# Patient Record
Sex: Male | Born: 1985 | Race: Black or African American | Hispanic: No | Marital: Single | Smoking: Current every day smoker
Health system: Southern US, Community
[De-identification: ages and names within clinical notes are randomized; demographics above are authoritative.]

## PROBLEM LIST (undated history)

## (undated) HISTORY — PX: DENTAL SURGERY: SHX609

---
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
## 2005-02-14 ENCOUNTER — Emergency Department (HOSPITAL_COMMUNITY): Admission: EM | Admit: 2005-02-14 | Discharge: 2005-02-14 | Payer: Self-pay | Admitting: Emergency Medicine

## 2005-02-15 ENCOUNTER — Emergency Department (HOSPITAL_COMMUNITY): Admission: EM | Admit: 2005-02-15 | Discharge: 2005-02-15 | Payer: Self-pay | Admitting: Emergency Medicine

## 2005-07-04 IMAGING — CR DG CHEST 1V PORT
1 series · 1 of 1 positions shown · non-contrast
Comparison: None.

CLINICAL DATA: 18-year-old, unresponsive.  Respiratory distress.  

 PORTABLE CHEST – ONE VIEW:

[view not recorded]
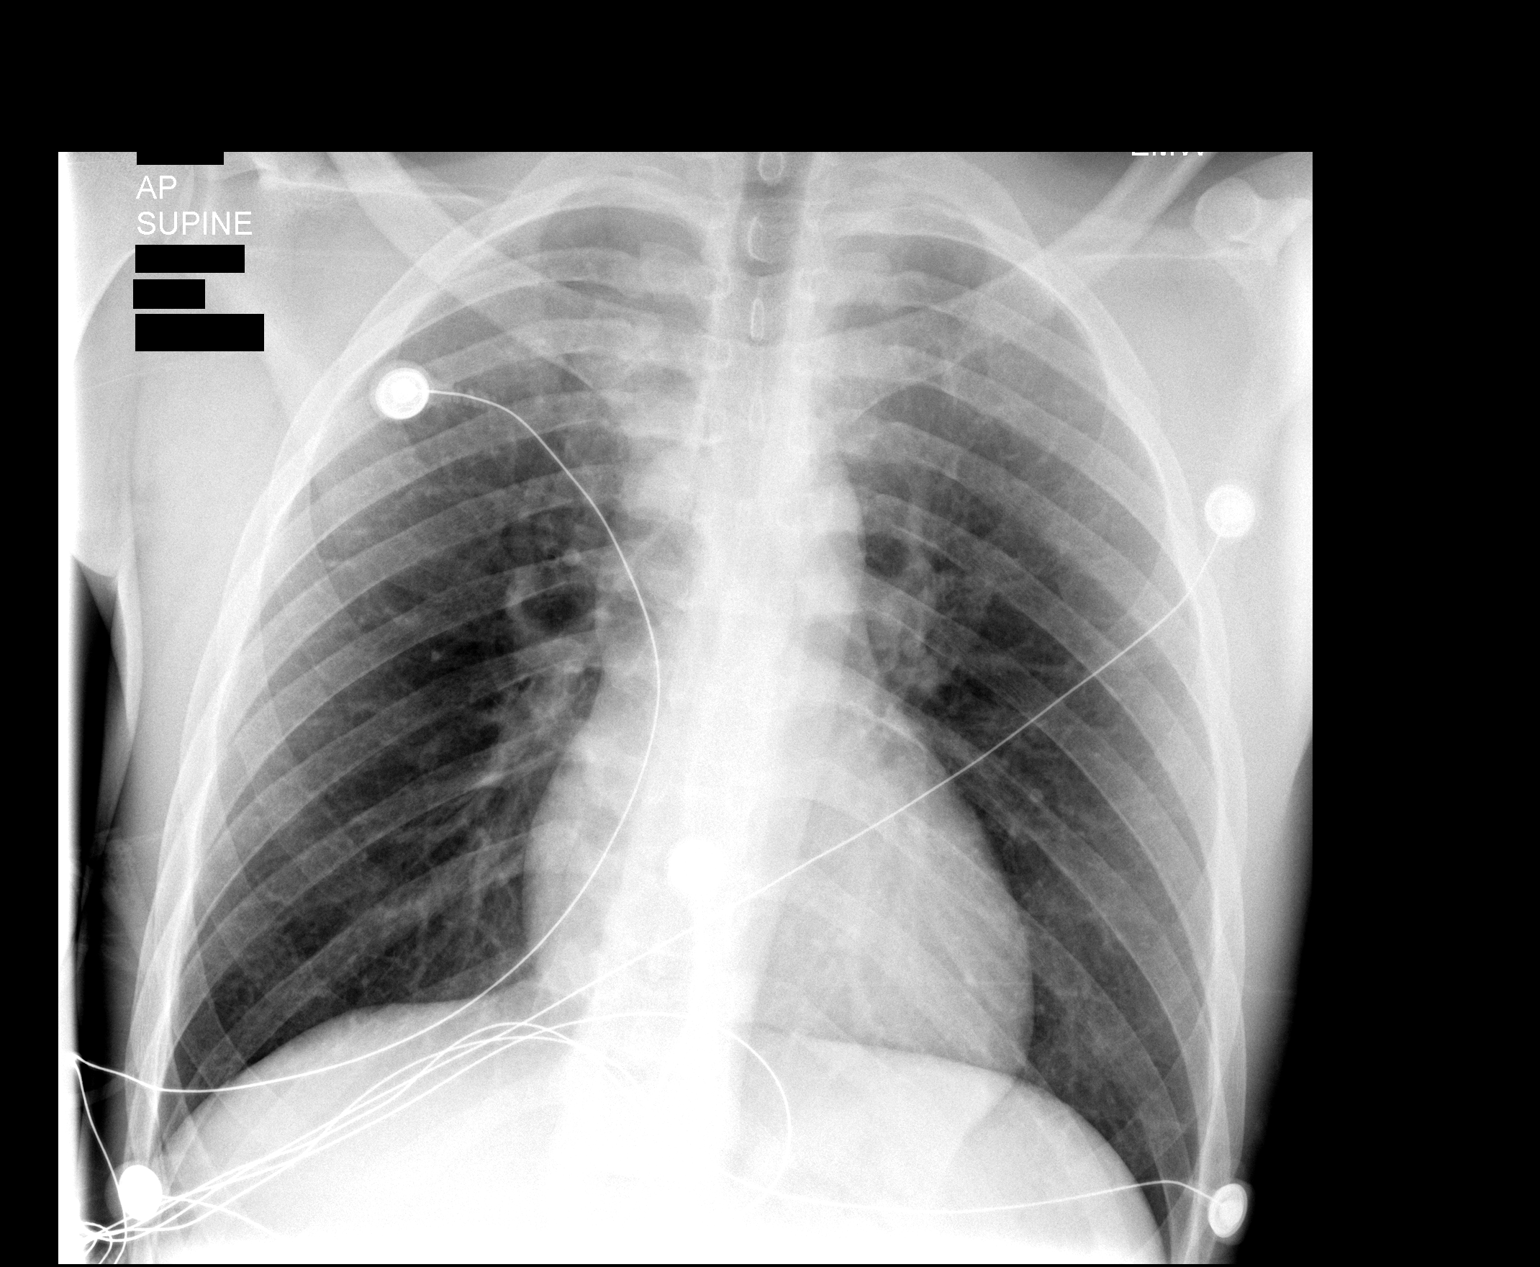

[1 of 1 positions shown; findings below may reference images not displayed]

The heart size and mediastinal contours are within normal limits. The lungs are clear.
IMPRESSION: No acute disease.

## 2007-12-14 ENCOUNTER — Emergency Department (HOSPITAL_COMMUNITY): Admission: EM | Admit: 2007-12-14 | Discharge: 2007-12-14 | Payer: Self-pay | Admitting: Emergency Medicine

## 2008-04-15 ENCOUNTER — Emergency Department (HOSPITAL_COMMUNITY): Admission: EM | Admit: 2008-04-15 | Discharge: 2008-04-15 | Payer: Self-pay | Admitting: Emergency Medicine

## 2008-11-22 ENCOUNTER — Emergency Department (HOSPITAL_COMMUNITY): Admission: EM | Admit: 2008-11-22 | Discharge: 2008-11-22 | Payer: Self-pay | Admitting: Emergency Medicine

## 2011-02-13 ENCOUNTER — Emergency Department (HOSPITAL_COMMUNITY)
Admission: EM | Admit: 2011-02-13 | Discharge: 2011-02-13 | Disposition: A | Discharge status: Home / Self Care | Payer: Self-pay | Attending: Emergency Medicine | Admitting: Emergency Medicine

## 2011-02-13 ENCOUNTER — Emergency Department (HOSPITAL_COMMUNITY)
Admission: EM | Admit: 2011-02-13 | Discharge: 2011-02-14 | Disposition: A | Discharge status: Home / Self Care | Payer: Self-pay | Attending: Emergency Medicine | Admitting: Emergency Medicine

## 2011-02-13 DIAGNOSIS — R51 Headache: Secondary | ICD-10-CM | POA: Insufficient documentation

## 2011-02-13 DIAGNOSIS — K006 Disturbances in tooth eruption: Secondary | ICD-10-CM | POA: Insufficient documentation

## 2011-02-13 DIAGNOSIS — D1739 Benign lipomatous neoplasm of skin and subcutaneous tissue of other sites: Secondary | ICD-10-CM | POA: Insufficient documentation

## 2011-02-13 DIAGNOSIS — D179 Benign lipomatous neoplasm, unspecified: Secondary | ICD-10-CM | POA: Insufficient documentation

## 2012-01-06 ENCOUNTER — Encounter (HOSPITAL_COMMUNITY): Payer: Self-pay | Admitting: *Deleted

## 2012-01-06 ENCOUNTER — Emergency Department (HOSPITAL_COMMUNITY)
Admission: EM | Admit: 2012-01-06 | Discharge: 2012-01-06 | Disposition: A | Discharge status: Home / Self Care | Payer: Self-pay | Attending: Emergency Medicine | Admitting: Emergency Medicine

## 2012-01-06 DIAGNOSIS — I889 Nonspecific lymphadenitis, unspecified: Secondary | ICD-10-CM

## 2012-01-06 DIAGNOSIS — R599 Enlarged lymph nodes, unspecified: Secondary | ICD-10-CM | POA: Insufficient documentation

## 2012-01-06 DIAGNOSIS — K029 Dental caries, unspecified: Secondary | ICD-10-CM | POA: Insufficient documentation

## 2012-01-06 MED ORDER — CEPHALEXIN 500 MG PO CAPS: 500.0000 mg | ORAL_CAPSULE | Freq: Four times a day (QID) | ORAL | Status: AC

## 2012-01-06 MED ORDER — NAPROXEN 500 MG PO TABS: 500.0000 mg | ORAL_TABLET | Freq: Two times a day (BID) | ORAL | Status: AC

## 2012-01-06 MED ORDER — CEPHALEXIN 250 MG PO CAPS
500.0000 mg | ORAL_CAPSULE | Freq: Once | ORAL | Status: AC
  Administered 2012-01-06: 500 mg via ORAL
  Filled 2012-01-06: qty 2

## 2012-01-06 NOTE — Discharge Instructions (Signed)
REturn to the ER immediately for difficulty swallowing or breathing, eat soft foods and take the antibiotic for possible infection.  If you do not get better with the antibiotic over the next 7 days, you should be seen and reevaluated by a physician - see list below.  If you develop severe pain, swelling, difficulty breathing or swallowing, return to the ER immediately.  RESOURCE GUIDE  Dental Problems  Patients with Medicaid: [facility] [phone] [address]                                           [phone] [address] Phone:  [phone]                                                  Phone:  [phone]  If unable to pay or uninsured, contact:  [facility] or [facility]. to become qualified for the adult dental clinic.  Chronic Pain Problems Contact [facility]  [phone] Patients need to be referred by their primary care doctor.  Insufficient Money for Medicine Contact [employer]:  call "211" or [facility] 774-616-1925.  No Primary Care Doctor Call [facility]  [phone] Other agencies that provide inexpensive medical care    [facility]  [phone]    [facility]  [phone]    [facility]  [phone]    [facility]  [phone]    [facility]  [phone]    [facility]  [phone]  Psychological Services [facility]  [phone] [facility]  [phone] [facility]   [phone] (emergency services [phone])  Substance Abuse Resources [facility]  [phone] [facility] [phone] [employer] [phone] Floydene Flock [phone] Residential & Outpatient Substance Abuse Program  [phone]  Abuse/Neglect [facility] Child Abuse Hotline 912-735-2374 [facility] Child Abuse Hotline 757-233-2834 (After Hours)  Emergency Shelter [facility]  Ministries 7183589930  M[facility] [place] [facility] [phone] [facility] [phone]  MRSA Hotline #:   276-113-3373    [facility] Resources  [facility]     [employer]                          [facility] Dept. [address] [place]                       [address]      [address]  [place]                                                Cristobal Goldmann Phone:  [phone]                                   Phone:  [phone]                 Phone:  [phone]  [facility] Mental [facility] Phone:  [phone]  [facility] Child Abuse Hotline 909-641-0679 [phone] (After Hours)

## 2012-01-06 NOTE — ED Notes (Signed)
Pt stated his neck has been swollen for the past four days.  Denies difficulty swallowing or breathing.  Stated he has been unable to eat due to intolerable pain.

## 2012-01-06 NOTE — ED Notes (Signed)
Reports swollen lymph nodes on (L) side x 4 days.  Reports pain with eating.  No difficulty swallowing.  Pt is noted to have a quarter size nodule on the (L) side of his neck.  respiratory unaffected.

## 2012-01-06 NOTE — ED Provider Notes (Signed)
History     CSN: [id number]  Arrival date & time 01/06/12  0007   First MD Initiated Contact with Patient 01/06/12 0113      Chief Complaint  Patient presents with  . Lymphadenopathy    (Consider location/radiation/quality/duration/timing/severity/associated sxs/prior treatment) HPI Comments: Pt had gradual onset of swelling in his left anterior cervical neck approx 4 days ago - gradually getting worse, no associated fevers, vomiting, sore throat, nasal congestion, SOB, cough or other c/o - no redness or warmth to the area 0 states worse when he eats.  He has a couple of cavities in the rear molars but has no pain in his teeth tonight.  Sx are persistent.  He has not seen a physician prior to arrival.  The history is provided by the patient.    History reviewed. No pertinent past medical history.  History reviewed. No pertinent past surgical history.  History reviewed. No pertinent family history.  History  Substance Use Topics  . Smoking status: Current Everyday Smoker -- 0.2 packs/day  . Smokeless tobacco: Not on file  . Alcohol Use: No      Review of Systems  All other systems reviewed and are negative.    Allergies  Review of patient's allergies indicates no known allergies.  Home Medications   Current Outpatient Rx  Name Route Sig Dispense Refill  . CEPHALEXIN 500 MG PO CAPS Oral Take 1 capsule (500 mg total) by mouth 4 (four) times daily. 40 capsule 0  . NAPROXEN 500 MG PO TABS Oral Take 1 tablet (500 mg total) by mouth 2 (two) times daily with a meal. 30 tablet 0    BP 138/81  Pulse 67  Temp(Src) 98.1 F (36.7 C) (Oral)  Resp 18  SpO2 100%  Physical Exam  Nursing note and vitals reviewed. Constitutional: He appears well-developed and well-nourished. No distress.  HENT:  Head: Normocephalic and atraumatic.  Mouth/Throat: Oropharynx is clear and moist. No oropharyngeal exudate.       O P clear, no erytyhema, exudate, asymetry or hypertrophy.   Normal speech, no ttp over teeth or under the tongue.  Molars with cavities but no ttp over gingiva or abscesses.  No ttp sublingual  Head with no signs of infection, rash.  Eyes: Conjunctivae and EOM are normal. Pupils are equal, round, and reactive to light. Right eye exhibits no discharge. Left eye exhibits no discharge. No scleral icterus.  Neck: Normal range of motion. Neck supple. No JVD present. No thyromegaly present.  Cardiovascular: Normal rate, regular rhythm, normal heart sounds and intact distal pulses.  Exam reveals no gallop and no friction rub.   No murmur heard. Pulmonary/Chest: Effort normal and breath sounds normal. No respiratory distress. He has no wheezes. He has no rales.  Abdominal: Soft. Bowel sounds are normal. He exhibits no distension and no mass. There is no tenderness.  Musculoskeletal: Normal range of motion. He exhibits no edema and no tenderness.  Lymphadenopathy:    He has cervical adenopathy ( single isolated 2 cm anterior cervical mobile rubbery LN on the left).  Neurological: He is alert. Coordination normal.  Skin: Skin is warm and dry. No rash noted. No erythema.  Psychiatric: He has a normal mood and affect. His behavior is normal.    ED Course  Procedures (including critical care time)  Labs Reviewed - No data to display No results found.   1. Lymphadenitis       MDM  Swollen LN but no other signs of infection -  will need abx, but I have warned pt that other entities can cause this and he needs close recheck in one week if not better - f/u list given.  Discharge Prescriptions include:  #! Keflex #2 Naprosyn        Vida Roller, MD 01/06/12 0140

## 2013-04-22 ENCOUNTER — Encounter (HOSPITAL_COMMUNITY): Payer: Self-pay | Admitting: Emergency Medicine

## 2013-04-22 ENCOUNTER — Emergency Department (INDEPENDENT_AMBULATORY_CARE_PROVIDER_SITE_OTHER)
Admission: EM | Admit: 2013-04-22 | Discharge: 2013-04-22 | Disposition: A | Discharge status: Home / Self Care | Payer: Self-pay | Source: Home / Self Care

## 2013-04-22 DIAGNOSIS — L02415 Cutaneous abscess of right lower limb: Secondary | ICD-10-CM

## 2013-04-22 DIAGNOSIS — L02419 Cutaneous abscess of limb, unspecified: Secondary | ICD-10-CM

## 2013-04-22 MED ORDER — SULFAMETHOXAZOLE-TRIMETHOPRIM 800-160 MG PO TABS: 1.0000 | ORAL_TABLET | Freq: Two times a day (BID) | ORAL | Status: DC

## 2013-04-22 MED ORDER — HYDROCODONE-ACETAMINOPHEN 5-325 MG PO TABS: 1.0000 | ORAL_TABLET | ORAL | Status: DC | PRN

## 2013-04-22 NOTE — ED Notes (Signed)
Pt c/o abscess on groin area onset yest Sx include: pain and swelling Denies: drainage, f/v/n/d Hx of freq abscesses  He is alert and oriented w/no signs of acute distress.

## 2013-04-22 NOTE — ED Provider Notes (Signed)
History     CSN: [id number]  Arrival date & time 04/22/13  1154   None     Chief Complaint  Patient presents with  . Abscess    (Consider location/radiation/quality/duration/timing/severity/associated sxs/prior treatment) HPI Comments: Patient complains of a recurring infected lesion in the right upper medial thigh. It was large and painful last night however during the day today it has decreased in size. Denies constitutional symptoms such as fever.   History reviewed. No pertinent past medical history.  History reviewed. No pertinent past surgical history.  No family history on file.  History  Substance Use Topics  . Smoking status: Current Every Day Smoker -- 0.20 packs/day  . Smokeless tobacco: Not on file  . Alcohol Use: No      Review of Systems  Constitutional: Negative.   Skin:       As per history of present illness  Psychiatric/Behavioral: Negative.   All other systems reviewed and are negative.    Allergies  Review of patient's allergies indicates no known allergies.  Home Medications   Current Outpatient Rx  Name  Route  Sig  Dispense  Refill  . HYDROcodone-acetaminophen (NORCO/VICODIN) 5-325 MG per tablet   Oral   Take 1 tablet by mouth every 4 (four) hours as needed for pain.   10 tablet   0   . sulfamethoxazole-trimethoprim (SEPTRA DS) 800-160 MG per tablet   Oral   Take 1 tablet by mouth 2 (two) times daily. X 5 days   10 tablet   0     BP 112/67  Pulse 64  Temp(Src) 97.9 F (36.6 C) (Oral)  Resp 16  SpO2 100%  Physical Exam  Nursing note and vitals reviewed. Constitutional: He is oriented to person, place, and time. He appears well-developed and well-nourished. No distress.  Neck: Normal range of motion. Neck supple.  Pulmonary/Chest: Effort normal.  Musculoskeletal: Normal range of motion.  Neurological: He is alert and oriented to person, place, and time. He exhibits normal muscle tone.  Skin: Skin is warm and dry. No  rash noted.  Is approximately 4 cm area of superficial induration with a center pustule located on the right upper medial thigh. Positive for fluctuance. No lymphangitis or erythema. No current drainage.  Psychiatric: He has a normal mood and affect.    ED Course  INCISION AND DRAINAGE Date/Time: 04/22/2013 2:25 PM Performed by: Phineas Real, Lino Wickliff Authorized by: Phineas Real, Dewain Platz Consent: Verbal consent obtained. Risks and benefits: risks, benefits and alternatives were discussed Consent given by: patient Patient understanding: patient states understanding of the procedure being performed Patient identity confirmed: verbally with patient Type: abscess Body area: lower extremity Location details: right leg Anesthesia: local infiltration Local anesthetic: lidocaine 2% with epinephrine Anesthetic total: 4 ml Patient sedated: no Scalpel size: 11 Incision type: single with marsupialization Complexity: complex Drainage: purulent and bloody Drainage amount: moderate Wound treatment: drain placed Packing material: 1/4 in gauze Comments: After injury into the wound the tissue appeared to be more like a solid cystic lesion associated with a pocket of purulence. Since this is recurring I suspect this may be an infected cystic type lesion. The marsupialization was superficial and conservative.   (including critical care time)  Labs Reviewed  CULTURE, ROUTINE-ABSCESS   No results found.   1. Abscess of right thigh       MDM   Setpra ds BID x 5 d Culture wound RTO 2days for wound check Lortab 5 q 4h prn pain.  Hayden Rasmussen, NP 04/22/13 [phone]

## 2013-04-24 ENCOUNTER — Emergency Department (INDEPENDENT_AMBULATORY_CARE_PROVIDER_SITE_OTHER)
Admission: EM | Admit: 2013-04-24 | Discharge: 2013-04-24 | Disposition: A | Discharge status: Home / Self Care | Payer: Self-pay | Source: Home / Self Care | Attending: Family Medicine | Admitting: Family Medicine

## 2013-04-24 ENCOUNTER — Encounter (HOSPITAL_COMMUNITY): Payer: Self-pay

## 2013-04-24 DIAGNOSIS — Z5189 Encounter for other specified aftercare: Secondary | ICD-10-CM

## 2013-04-24 NOTE — ED Notes (Signed)
Here for wound check and poss packing removal: NAD

## 2013-04-24 NOTE — ED Provider Notes (Signed)
History     CSN: [id number]  Arrival date & time 04/24/13  1012   First MD Initiated Contact with Patient 04/24/13 1038      Chief Complaint  Patient presents with  . Wound Check    (Consider location/radiation/quality/duration/timing/severity/associated sxs/prior treatment) HPI Comments: 27 year old male is here for wound check and removal of iodoform packing. He was seen by this provider 2 days ago for an abscess to the right medial thigh. The abscess was incised and drained and packed. He is taking his antibiotics and applying warm compresses. There is a small amount of purulent drainage to the bandage. He states the lesion is feeling better pain is better.  Patient is a 27 y.o. male presenting with wound check. The history is provided by the patient.  Wound Check    History reviewed. No pertinent past medical history.  History reviewed. No pertinent past surgical history.  History reviewed. No pertinent family history.  History  Substance Use Topics  . Smoking status: Current Every Day Smoker -- 0.20 packs/day  . Smokeless tobacco: Not on file  . Alcohol Use: No      Review of Systems  All other systems reviewed and are negative.    Allergies  Review of patient's allergies indicates no known allergies.  Home Medications   Current Outpatient Rx  Name  Route  Sig  Dispense  Refill  . HYDROcodone-acetaminophen (NORCO/VICODIN) 5-325 MG per tablet   Oral   Take 1 tablet by mouth every 4 (four) hours as needed for pain.   10 tablet   0   . sulfamethoxazole-trimethoprim (SEPTRA DS) 800-160 MG per tablet   Oral   Take 1 tablet by mouth 2 (two) times daily. X 5 days   10 tablet   0     BP 126/74  Pulse 62  Temp(Src) 98.6 F (37 C) (Oral)  Resp 16  SpO2 100%  Physical Exam  Nursing note and vitals reviewed. Constitutional: He is oriented to person, place, and time. He appears well-developed and well-nourished. He appears distressed.  Pulmonary/Chest:  Effort normal.  Musculoskeletal: Normal range of motion.  Neurological: He is alert and oriented to person, place, and time.  Skin: Skin is warm and dry.  There remains a smaller area of induration around the wound. After the packing was removed there is no further drainage or bleeding. No lymphangitis or erythema.  Psychiatric: He has a normal mood and affect.    ED Course  Procedures (including critical care time)  Labs Reviewed - No data to display No results found.   1. Wound check, abscess       MDM  The wound packing material was removed. And there is no additional drainage. A Band-Aid was placed over the opening. Patient is recommended to complete the antibiotics and continue to place warm compresses over the wound. For any worsening new symptoms or problems may return. Recommended if he is going to shave the area that he make sure that the razor is new and clean and that the area for which she shaves is washed well and prepped with isopropyl alcohol.  Hayden Rasmussen, NP 04/24/13 1134

## 2013-04-25 LAB — CULTURE, ROUTINE-ABSCESS

## 2013-05-01 NOTE — ED Provider Notes (Signed)
Medical screening examination/treatment/procedure(s) were performed by resident physician or non-physician practitioner and as supervising physician I was immediately available for consultation/collaboration.   Barkley Bruns MD.   Linna Hoff, MD 05/01/13 [phone]

## 2013-05-01 NOTE — ED Provider Notes (Signed)
Medical screening examination/treatment/procedure(s) were performed by resident physician or non-physician practitioner and as supervising physician I was immediately available for consultation/collaboration.   Valborg Friar DOUGLAS MD.   Isrrael Fluckiger D Ziya Coonrod, MD 05/01/13 1138 

## 2013-11-05 ENCOUNTER — Emergency Department (INDEPENDENT_AMBULATORY_CARE_PROVIDER_SITE_OTHER)
Admission: EM | Admit: 2013-11-05 | Discharge: 2013-11-05 | Disposition: A | Discharge status: Home / Self Care | Payer: Self-pay | Source: Home / Self Care | Attending: Family Medicine | Admitting: Family Medicine

## 2013-11-05 ENCOUNTER — Encounter (HOSPITAL_COMMUNITY): Payer: Self-pay | Admitting: Emergency Medicine

## 2013-11-05 DIAGNOSIS — K047 Periapical abscess without sinus: Secondary | ICD-10-CM

## 2013-11-05 MED ORDER — DICLOFENAC POTASSIUM 50 MG PO TABS: 50.0000 mg | ORAL_TABLET | Freq: Three times a day (TID) | ORAL | Status: DC

## 2013-11-05 MED ORDER — CLINDAMYCIN HCL 300 MG PO CAPS: 300.0000 mg | ORAL_CAPSULE | Freq: Three times a day (TID) | ORAL | Status: DC

## 2013-11-05 NOTE — ED Notes (Signed)
C/o bilateral upper jaw toothache off and on x 10 yrs., this episode onset 1430.  No dentist.

## 2013-11-05 NOTE — ED Provider Notes (Signed)
CSN: [id number]     Arrival date & time 11/05/13  1758 History   First MD Initiated Contact with Patient 11/05/13 1822     Chief Complaint  Patient presents with  . Dental Pain   (Consider location/radiation/quality/duration/timing/severity/associated sxs/prior Treatment) Patient is a 27 y.o. male presenting with tooth pain. The history is provided by the patient.  Dental Pain Location:  Upper Upper teeth location:  16/LU 3rd molar Quality:  Throbbing Severity:  Moderate Duration:  6 hours Timing:  Constant Progression:  Worsening Chronicity:  Chronic Context: dental caries   Associated symptoms: no facial swelling and no fever     History reviewed. No pertinent past medical history. History reviewed. No pertinent past surgical history. Family History  Problem Relation Age of Onset  . Diabetes Mother   . Diabetes Father    History  Substance Use Topics  . Smoking status: Current Every Day Smoker -- 0.20 packs/day    Types: Cigars  . Smokeless tobacco: Not on file  . Alcohol Use: Yes     Comment: occasionally    Review of Systems  Constitutional: Negative for fever.  HENT: Positive for dental problem. Negative for facial swelling.     Allergies  Review of patient's allergies indicates no known allergies.  Home Medications   Current Outpatient Rx  Name  Route  Sig  Dispense  Refill  . clindamycin (CLEOCIN) 300 MG capsule   Oral   Take 1 capsule (300 mg total) by mouth 3 (three) times daily.   21 capsule   0   . diclofenac (CATAFLAM) 50 MG tablet   Oral   Take 1 tablet (50 mg total) by mouth 3 (three) times daily.   21 tablet   0   . HYDROcodone-acetaminophen (NORCO/VICODIN) 5-325 MG per tablet   Oral   Take 1 tablet by mouth every 4 (four) hours as needed for pain.   10 tablet   0   . sulfamethoxazole-trimethoprim (SEPTRA DS) 800-160 MG per tablet   Oral   Take 1 tablet by mouth 2 (two) times daily. X 5 days   10 tablet   0    BP 138/91   Pulse 64  Temp(Src) 98.5 F (36.9 C) (Oral)  Resp 14  SpO2 100% Physical Exam  Nursing note and vitals reviewed. Constitutional: He appears well-developed and well-nourished.  HENT:  Head: Normocephalic.  Right Ear: External ear normal.  Left Ear: External ear normal.  Mouth/Throat: Uvula is midline, oropharynx is clear and moist and mucous membranes are normal. Abnormal dentition. Dental caries present.      ED Course  Procedures (including critical care time) Labs Review Labs Reviewed - No data to display Imaging Review No results found.  EKG Interpretation     Ventricular Rate:    PR Interval:    QRS Duration:   QT Interval:    QTC Calculation:   R Axis:     Text Interpretation:              MDM      Linna Hoff, MD 11/05/13 1927

## 2014-04-01 ENCOUNTER — Encounter (HOSPITAL_COMMUNITY): Payer: Self-pay | Admitting: Emergency Medicine

## 2014-04-01 ENCOUNTER — Emergency Department (HOSPITAL_COMMUNITY)
Admission: EM | Admit: 2014-04-01 | Discharge: 2014-04-01 | Disposition: A | Discharge status: Home / Self Care | Payer: Self-pay | Attending: Emergency Medicine | Admitting: Emergency Medicine

## 2014-04-01 DIAGNOSIS — K002 Abnormalities of size and form of teeth: Secondary | ICD-10-CM | POA: Insufficient documentation

## 2014-04-01 DIAGNOSIS — F17200 Nicotine dependence, unspecified, uncomplicated: Secondary | ICD-10-CM | POA: Insufficient documentation

## 2014-04-01 DIAGNOSIS — K0889 Other specified disorders of teeth and supporting structures: Secondary | ICD-10-CM

## 2014-04-01 DIAGNOSIS — K089 Disorder of teeth and supporting structures, unspecified: Secondary | ICD-10-CM | POA: Insufficient documentation

## 2014-04-01 DIAGNOSIS — Z791 Long term (current) use of non-steroidal anti-inflammatories (NSAID): Secondary | ICD-10-CM | POA: Insufficient documentation

## 2014-04-01 DIAGNOSIS — Z792 Long term (current) use of antibiotics: Secondary | ICD-10-CM | POA: Insufficient documentation

## 2014-04-01 MED ORDER — PENICILLIN V POTASSIUM 250 MG PO TABS: 250.0000 mg | ORAL_TABLET | Freq: Four times a day (QID) | ORAL | Status: AC

## 2014-04-01 MED ORDER — HYDROCODONE-ACETAMINOPHEN 5-325 MG PO TABS: 1.0000 | ORAL_TABLET | Freq: Four times a day (QID) | ORAL | Status: DC | PRN

## 2014-04-01 MED ORDER — HYDROCODONE-ACETAMINOPHEN 5-325 MG PO TABS
2.0000 | ORAL_TABLET | Freq: Once | ORAL | Status: AC
  Administered 2014-04-01: 2 via ORAL
  Filled 2014-04-01: qty 2

## 2014-04-01 NOTE — ED Provider Notes (Signed)
CSN: [id number]     Arrival date & time 04/01/14  2019 History  This chart was scribed for non-physician practitioner Montine Circle, PA-C working with Ezequiel Essex, MD by Rolanda Lundborg, ED Scribe. This patient was seen in room TR10C/TR10C and the patient's care was started at 8:42 PM.    Chief Complaint  Patient presents with  . Oral Swelling   The history is provided by the patient. No language interpreter was used.   HPI Comments: Dennis Pope is a 28 y.o. male who presents to the Emergency Department with the chief complaint of waxing and waning gland swelling in the left neck onset 2 1/2 years ago. He states it started flaring up yesterday. He reports associated pain with swallowing. He reports being told that the infection is coming from his teeth. He states it has swollen to the size of an egg in the past. He denies rhinorrhea, sore throat, or cough.    History reviewed. No pertinent past medical history. History reviewed. No pertinent past surgical history. Family History  Problem Relation Age of Onset  . Diabetes Mother   . Diabetes Father    History  Substance Use Topics  . Smoking status: Current Every Day Smoker -- 0.20 packs/day    Types: Cigars  . Smokeless tobacco: Not on file  . Alcohol Use: No    Review of Systems  Constitutional: Negative for fever.  HENT: Negative for rhinorrhea and sore throat.   Respiratory: Negative for cough.       Allergies  Review of patient's allergies indicates no known allergies.  Home Medications   Current Outpatient Rx  Name  Route  Sig  Dispense  Refill  . clindamycin (CLEOCIN) 300 MG capsule   Oral   Take 1 capsule (300 mg total) by mouth 3 (three) times daily.   21 capsule   0   . diclofenac (CATAFLAM) 50 MG tablet   Oral   Take 1 tablet (50 mg total) by mouth 3 (three) times daily.   21 tablet   0   . HYDROcodone-acetaminophen (NORCO/VICODIN) 5-325 MG per tablet   Oral   Take 1 tablet by mouth every 4  (four) hours as needed for pain.   10 tablet   0   . sulfamethoxazole-trimethoprim (SEPTRA DS) 800-160 MG per tablet   Oral   Take 1 tablet by mouth 2 (two) times daily. X 5 days   10 tablet   0    BP 133/78  Pulse 82  Temp(Src) 99.2 F (37.3 C) (Oral)  Resp 18  Ht 5\' 10"  (1.778 m)  Wt 157 lb 9.6 oz (71.487 kg)  BMI 22.61 kg/m2  SpO2 100% Physical Exam  Nursing note and vitals reviewed. Constitutional: He is oriented to person, place, and time. He appears well-developed and well-nourished. No distress.  HENT:  Head: Normocephalic and atraumatic.  Poor dentition throughout.  Affected tooth as diagrammed.  No signs of peritonsillar or tonsillar abscess.  No signs of gingival abscess. Oropharynx is clear and without exudates.  Uvula is midline.  Airway is intact. No signs of Ludwig's angina with palpation of oral and sublingual mucosa.   Eyes: EOM are normal.  Neck: Neck supple. No tracheal deviation present.  Cardiovascular: Normal rate.   Pulmonary/Chest: Effort normal. No respiratory distress.  Musculoskeletal: Normal range of motion.  Neurological: He is alert and oriented to person, place, and time.  Skin: Skin is warm and dry.  Psychiatric: He has a normal mood and  affect. His behavior is normal.    ED Course  Procedures (including critical care time) Medications - No data to display  DIAGNOSTIC STUDIES: Oxygen Saturation is 100% on RA, normal by my interpretation.    COORDINATION OF CARE: 8:57 PM- Discussed treatment plan with pt which includes antibiotics and pain medications. Advised pt to see a specialist if symptoms do not improve. Pt agrees to plan.    Labs Review Labs Reviewed - No data to display Imaging Review No results found.   EKG Interpretation None      MDM   Final diagnoses:  Pain, dental    Patient with toothache.  No gross abscess.  Exam unconcerning for Ludwig's angina or spread of infection.  Will treat with penicillin and pain  medicine.  Urged patient to follow-up with dentist.     I personally performed the services described in this documentation, which was scribed in my presence. The recorded information has been reviewed and is accurate.    Montine Circle, PA-C 04/02/14 [phone]

## 2014-04-01 NOTE — Discharge Instructions (Signed)

## 2014-04-01 NOTE — ED Notes (Signed)
Pt states that he has "swollen glands" on the left side of his neck that has been going on for 2.5 years.  Pt states he has been told in the past that it is coming from an infection in his teeth

## 2014-04-02 NOTE — ED Provider Notes (Signed)
Medical screening examination/treatment/procedure(s) were performed by non-physician practitioner and as supervising physician I was immediately available for consultation/collaboration.   EKG Interpretation None        Ezequiel Essex, MD 04/02/14 [phone]

## 2014-10-21 ENCOUNTER — Encounter (HOSPITAL_COMMUNITY): Payer: Self-pay | Admitting: Emergency Medicine

## 2014-10-21 ENCOUNTER — Emergency Department (INDEPENDENT_AMBULATORY_CARE_PROVIDER_SITE_OTHER)
Admission: EM | Admit: 2014-10-21 | Discharge: 2014-10-21 | Disposition: A | Discharge status: Home / Self Care | Payer: Self-pay | Source: Home / Self Care | Attending: Family Medicine | Admitting: Family Medicine

## 2014-10-21 DIAGNOSIS — K0889 Other specified disorders of teeth and supporting structures: Secondary | ICD-10-CM

## 2014-10-21 DIAGNOSIS — K088 Other specified disorders of teeth and supporting structures: Secondary | ICD-10-CM

## 2014-10-21 MED ORDER — TRAMADOL HCL 50 MG PO TABS
50.0000 mg | ORAL_TABLET | Freq: Four times a day (QID) | ORAL | Status: DC | PRN
Start: 2014-10-21 — End: 2015-01-18

## 2014-10-21 MED ORDER — AMOXICILLIN 500 MG PO CAPS: 500.0000 mg | ORAL_CAPSULE | Freq: Three times a day (TID) | ORAL | Status: DC

## 2014-10-21 NOTE — ED Provider Notes (Signed)
Dennis Pope is a 28 y.o. male who presents to Urgent Care today for tooth pain.  Patient has bilateral upper wisdom tooth pain present for 5 days. This is a recurrence of previous pain. He notes the pain is severe. The pain is worse with chewing. No fevers or chills nausea vomiting or diarrhea. No chest pains palpitations or shortness of breath. He has tried over-the-counter medications which have not helped.   History reviewed. No pertinent past medical history. History  Substance Use Topics  . Smoking status: Current Every Day Smoker -- 0.20 packs/day    Types: Cigars  . Smokeless tobacco: Not on file  . Alcohol Use: No   ROS as above Medications: No current facility-administered medications for this encounter.   Current Outpatient Prescriptions  Medication Sig Dispense Refill  . amoxicillin (AMOXIL) 500 MG capsule Take 1 capsule (500 mg total) by mouth 3 (three) times daily.  30 capsule  0  . traMADol (ULTRAM) 50 MG tablet Take 1 tablet (50 mg total) by mouth every 6 (six) hours as needed.  15 tablet  0    Exam:  BP 153/98  Pulse 61  Temp(Src) 97.6 F (36.4 C) (Oral)  Resp 16  SpO2 100% Gen: Well NAD HEENT: EOMI,  MMM upper bilateral wisdom teeth eroded to gumline tender with gumline erythema.  No results found for this or any previous visit (from the past 24 hour(s)). No results found.  Assessment and Plan: 28 y.o. male with tooth pain. Amoxicillin, tramadol follow-up as needed.  Discussed warning signs or symptoms. Please see discharge instructions. Patient expresses understanding.     Gregor Hams, MD 10/21/14 [phone]

## 2014-10-21 NOTE — ED Notes (Signed)
Pt states that he has tooth pain his upper right and left wisdom teeth need to be removed and are very painful per pt. Pt is not in any acute distress at this time.

## 2014-10-21 NOTE — Discharge Instructions (Signed)
Thank you for coming in today. ° ° °Dental Pain °A tooth ache may be caused by cavities (tooth decay). Cavities expose the nerve of the tooth to air and hot or cold temperatures. It may come from an infection or abscess (also called a boil or furuncle) around your tooth. It is also often caused by dental caries (tooth decay). This causes the pain you are having. °DIAGNOSIS  °Your caregiver can diagnose this problem by exam. °TREATMENT  °· If caused by an infection, it may be treated with medications which kill germs (antibiotics) and pain medications as prescribed by your caregiver. Take medications as directed. °· Only take over-the-counter or prescription medicines for pain, discomfort, or fever as directed by your caregiver. °· Whether the tooth ache today is caused by infection or dental disease, you should see your dentist as soon as possible for further care. °SEEK MEDICAL CARE IF: °The exam and treatment you received today has been provided on an emergency basis only. This is not a substitute for complete medical or dental care. If your problem worsens or new problems (symptoms) appear, and you are unable to meet with your dentist, call or return to this location. °SEEK IMMEDIATE MEDICAL CARE IF:  °· You have a fever. °· You develop redness and swelling of your face, jaw, or neck. °· You are unable to open your mouth. °· You have severe pain uncontrolled by pain medicine. °MAKE SURE YOU:  °· Understand these instructions. °· Will watch your condition. °· Will get help right away if you are not doing well or get worse. °Document Released: 12/10/2005 Document Revised: 03/03/2012 Document Reviewed: 07/28/2008 °ExitCare® Patient Information ©2015 [employer]. This information is not intended to replace advice given to you by your health care provider. Make sure you discuss any questions you have with your health care provider. ° ° °Low[facility]Cost [facility]: ° °[facility] - [phone] (ext  50251) ° °[address] ° °Please call Dr. Civils office [phone] or cell [phone] °[address], [place]  °Cost for tooth removal $200 includes exam, Xray, and extraction and follow up visit.  °Bring list of current medications with you.  ° °[facility] - [phone] ° °[facility] - [phone] ° °[address] ° °[facility] ° °[address], [place], [place], [address] ° °[phone], Ext. 123 ° °2nd and 4th Thursday of the month at 6:30am (Simple extractions only - no wisdom teeth or surgery) First come/First serve -First 10 clients served ° °[facility] ([place] residents only) ° °[address], [place], [place], [address] ° °[phone] ° °[facility] ° °[phone] ° °[facility] ° °[phone] ° °[facility] ° °[phone] ° °Please call [facility] at [phone] to get the details to get your tooth pulled.  ° °

## 2015-01-18 ENCOUNTER — Emergency Department (INDEPENDENT_AMBULATORY_CARE_PROVIDER_SITE_OTHER)
Admission: EM | Admit: 2015-01-18 | Discharge: 2015-01-18 | Disposition: A | Discharge status: Home / Self Care | Payer: Self-pay | Source: Home / Self Care | Attending: Emergency Medicine | Admitting: Emergency Medicine

## 2015-01-18 ENCOUNTER — Encounter (HOSPITAL_COMMUNITY): Payer: Self-pay | Admitting: Emergency Medicine

## 2015-01-18 DIAGNOSIS — K0889 Other specified disorders of teeth and supporting structures: Secondary | ICD-10-CM

## 2015-01-18 DIAGNOSIS — K088 Other specified disorders of teeth and supporting structures: Secondary | ICD-10-CM

## 2015-01-18 MED ORDER — TRAMADOL HCL 50 MG PO TABS: ORAL_TABLET | ORAL | Status: DC

## 2015-01-18 MED ORDER — AMOXICILLIN 500 MG PO CAPS: 500.0000 mg | ORAL_CAPSULE | Freq: Three times a day (TID) | ORAL | Status: DC

## 2015-01-18 NOTE — ED Notes (Signed)
Headache and toothache, right upper tooth pain

## 2015-01-18 NOTE — Discharge Instructions (Signed)

## 2015-01-18 NOTE — ED Provider Notes (Signed)
CSN: [id number]     Arrival date & time 01/18/15  1802 History   First MD Initiated Contact with Patient 01/18/15 1932     Chief Complaint  Patient presents with  . Headache  . Dental Pain   (Consider location/radiation/quality/duration/timing/severity/associated sxs/prior Treatment) HPI Comments: 29 year old male with recurrent toothache. The primary site of this toothache is the upper right posterior most molar. He has been seen for the third molar pain several times. States he does not have the money to see a dentist. There is evidence of the molars a rupturing through the gingiva and already having cavernous and eroding lesions. There is mild swelling around the right upper molars. No abscess formation is seen.   History reviewed. No pertinent past medical history. History reviewed. No pertinent past surgical history. Family History  Problem Relation Age of Onset  . Diabetes Mother   . Diabetes Father    History  Substance Use Topics  . Smoking status: Current Every Day Smoker -- 0.20 packs/day    Types: Cigars  . Smokeless tobacco: Not on file  . Alcohol Use: No    Review of Systems  HENT: Positive for dental problem.        As per history of present illness    Allergies  Review of patient's allergies indicates no known allergies.  Home Medications   Prior to Admission medications   Medication Sig Start Date End Date Taking? Authorizing Provider  amoxicillin (AMOXIL) 500 MG capsule Take 1 capsule (500 mg total) by mouth 3 (three) times daily. 01/18/15   Janne Napoleon, NP  traMADol (ULTRAM) 50 MG tablet 1-2 tabs po q 6 hr prn pain Maximum dose= 8 tablets per day 01/18/15   Janne Napoleon, NP   BP 122/88 mmHg  Pulse 77  Temp(Src) 98.8 F (37.1 C) (Oral)  Resp 18  SpO2 100% Physical Exam  Constitutional: He is oriented to person, place, and time. He appears well-developed and well-nourished. No distress.  HENT:  Mouth/Throat: Oropharynx is clear and moist. No oropharyngeal  exudate.  As in history of present illness there is mild gingival swelling to the right upper third molar which is partially erupting through the gingiva. Several areas of small cavernous lesions to the upper left and right molars.  Neck: Normal range of motion. Neck supple.  Lymphadenopathy:    He has no cervical adenopathy.  Neurological: He is alert and oriented to person, place, and time.  Skin: Skin is warm and dry.  Nursing note and vitals reviewed.   ED Course  Procedures (including critical care time) Labs Review Labs Reviewed - No data to display  Imaging Review No results found.   MDM   1. Pain, dental    Tramadol 50 mg #20 Amoxicillin as dir Needs to see dentist ASAP     Janne Napoleon, NP 01/18/15 1948

## 2015-04-16 ENCOUNTER — Encounter (HOSPITAL_COMMUNITY): Payer: Self-pay | Admitting: Emergency Medicine

## 2015-04-16 ENCOUNTER — Emergency Department (HOSPITAL_COMMUNITY)
Admission: EM | Admit: 2015-04-16 | Discharge: 2015-04-16 | Disposition: A | Discharge status: Home / Self Care | Payer: Self-pay | Attending: Emergency Medicine | Admitting: Emergency Medicine

## 2015-04-16 DIAGNOSIS — M542 Cervicalgia: Secondary | ICD-10-CM | POA: Insufficient documentation

## 2015-04-16 DIAGNOSIS — Z720 Tobacco use: Secondary | ICD-10-CM | POA: Insufficient documentation

## 2015-04-16 DIAGNOSIS — K029 Dental caries, unspecified: Secondary | ICD-10-CM | POA: Insufficient documentation

## 2015-04-16 MED ORDER — TRAMADOL HCL 50 MG PO TABS: 50.0000 mg | ORAL_TABLET | Freq: Four times a day (QID) | ORAL | Status: DC | PRN

## 2015-04-16 MED ORDER — PENICILLIN V POTASSIUM 500 MG PO TABS: 500.0000 mg | ORAL_TABLET | Freq: Three times a day (TID) | ORAL | Status: DC

## 2015-04-16 NOTE — ED Notes (Signed)
Patient here with complaint of left lower dental pain and neck swelling/pressure. States history of similar. Presents tonight for increased pain.

## 2015-04-16 NOTE — ED Provider Notes (Signed)
CSN: [id number]     Arrival date & time 04/16/15  2032 History   First MD Initiated Contact with Patient 04/16/15 2057     Chief Complaint  Patient presents with  . Dental Pain  . Neck Pain     (Consider location/radiation/quality/duration/timing/severity/associated sxs/prior Treatment) Patient is a 28 y.o. male presenting with tooth pain and neck pain. The history is provided by the patient. No language interpreter was used.  Dental Pain Location:  Upper Upper teeth location:  16/LU 3rd molar and 1/RU 3rd molar Associated symptoms: neck pain   Associated symptoms: no facial swelling and no fever   Associated symptoms comment:  Upper rear molar pain that is recurrent x 2 days. No facial swelling or fever. He reports significant decay of molars and that he has been told in the past they need to be removed. He states he has been unable to have extractions secondary to financial constraints.  Neck Pain Associated symptoms: no fever     History reviewed. No pertinent past medical history. History reviewed. No pertinent past surgical history. History reviewed. No pertinent family history. History  Substance Use Topics  . Smoking status: Current Every Day Smoker    Types: Cigars  . Smokeless tobacco: Not on file  . Alcohol Use: No    Review of Systems  Constitutional: Negative for fever and chills.  HENT: Negative for ear pain, facial swelling and trouble swallowing.        Toothache.  Gastrointestinal: Negative.   Musculoskeletal: Positive for neck pain. Negative for myalgias.  Neurological: Negative.       Allergies  Review of patient's allergies indicates no known allergies.  Home Medications   Prior to Admission medications   Not on File   BP 149/85 mmHg  Pulse 68  Temp(Src) 98.5 F (36.9 C) (Oral)  Resp 16  Ht 5' 9.5" (1.765 m)  Wt 158 lb 9 oz (71.923 kg)  BMI 23.09 kg/m2  SpO2 98% Physical Exam  Constitutional: He is oriented to person, place, and time.  He appears well-developed and well-nourished.  HENT:  Mouth/Throat: Oropharynx is clear and moist.  Significant decay to #'s 1 and 16. No visualized abscess.   Neck: Normal range of motion.  Pulmonary/Chest: Effort normal.  Musculoskeletal: Normal range of motion.  Lymphadenopathy:    He has cervical adenopathy.  Neurological: He is alert and oriented to person, place, and time.  Skin: Skin is warm and dry.  Psychiatric: He has a normal mood and affect.    ED Course  Procedures (including critical care time) Labs Review Labs Reviewed - No data to display  Imaging Review No results found.   EKG Interpretation None      MDM   Final diagnoses:  None    1. Dental decay  Resource list for dental care provided. Will cover with abx given degree of swelling/decay. Recommend ibuprofen. Rs: Damita Dunnings, PA-C 04/16/15 2108  Blanchie Dessert, MD 04/17/15 [phone]

## 2015-04-16 NOTE — Discharge Instructions (Signed)
Dental Pain A tooth ache may be caused by cavities (tooth decay). Cavities expose the nerve of the tooth to air and hot or cold temperatures. It may come from an infection or abscess (also called a boil or furuncle) around your tooth. It is also often caused by dental caries (tooth decay). This causes the pain you are having. DIAGNOSIS  Your caregiver can diagnose this problem by exam. TREATMENT   If caused by an infection, it may be treated with medications which kill germs (antibiotics) and pain medications as prescribed by your caregiver. Take medications as directed.  Only take over-the-counter or prescription medicines for pain, discomfort, or fever as directed by your caregiver.  Whether the tooth ache today is caused by infection or dental disease, you should see your dentist as soon as possible for further care. SEEK MEDICAL CARE IF: The exam and treatment you received today has been provided on an emergency basis only. This is not a substitute for complete medical or dental care. If your problem worsens or new problems (symptoms) appear, and you are unable to meet with your dentist, call or return to this location. SEEK IMMEDIATE MEDICAL CARE IF:   You have a fever.  You develop redness and swelling of your face, jaw, or neck.  You are unable to open your mouth.  You have severe pain uncontrolled by pain medicine. MAKE SURE YOU:   Understand these instructions.  Will watch your condition.  Will get help right away if you are not doing well or get worse. Document Released: 12/10/2005 Document Revised: 03/03/2012 Document Reviewed: 07/28/2008 [facility] Patient Information 2015 [place], [place]. This information is not intended to replace advice given to you by your health care provider. Make sure you discuss any questions you have with your health care provider.  Dental Caries Dental caries (also called tooth decay) is the most common oral disease. It can occur at any age but is  more common in children and young adults.  HOW DENTAL CARIES DEVELOPS  The process of decay begins when bacteria and foods (particularly sugars and starches) combine in your mouth to produce plaque. Plaque is a substance that sticks to the hard, outer surface of a tooth (enamel). The bacteria in plaque produce acids that attack enamel. These acids may also attack the root surface of a tooth (cementum) if it is exposed. Repeated attacks dissolve these surfaces and create holes in the tooth (cavities). If left untreated, the acids destroy the other layers of the tooth.  RISK FACTORS  Frequent sipping of sugary beverages.   Frequent snacking on sugary and starchy foods, especially those that easily get stuck in the teeth.   Poor oral hygiene.   Dry mouth.   Substance abuse such as methamphetamine abuse.   Broken or poor-fitting dental restorations.   Eating disorders.   Gastroesophageal reflux disease (GERD).   Certain radiation treatments to the head and neck. SYMPTOMS In the early stages of dental caries, symptoms are seldom present. Sometimes white, chalky areas may be seen on the enamel or other tooth layers. In later stages, symptoms may include:  Pits and holes on the enamel.  Toothache after sweet, hot, or cold foods or drinks are consumed.  Pain around the tooth.  Swelling around the tooth. DIAGNOSIS  Most of the time, dental caries is detected during a regular dental checkup. A diagnosis is made after a thorough medical and dental history is taken and the surfaces of your teeth are checked for signs of  dental caries. Sometimes special instruments, such as lasers, are used to check for dental caries. Dental X-ray exams may be taken so that areas not visible to the eye (such as between the contact areas of the teeth) can be checked for cavities.  TREATMENT  If dental caries is in its early stages, it may be reversed with a fluoride treatment or an application of a  remineralizing agent at the dental office. Thorough brushing and flossing at home is needed to aid these treatments. If it is in its later stages, treatment depends on the location and extent of tooth destruction:   If a small area of the tooth has been destroyed, the destroyed area will be removed and cavities will be filled with a material such as gold, silver amalgam, or composite resin.   If a large area of the tooth has been destroyed, the destroyed area will be removed and a cap (crown) will be fitted over the remaining tooth structure.   If the center part of the tooth (pulp) is affected, a procedure called a root canal will be needed before a filling or crown can be placed.   If most of the tooth has been destroyed, the tooth may need to be pulled (extracted). HOME CARE INSTRUCTIONS You can prevent, stop, or reverse dental caries at home by practicing good oral hygiene. Good oral hygiene includes:  Thoroughly cleaning your teeth at least twice a day with a toothbrush and dental floss.   Using a fluoride toothpaste. A fluoride mouth rinse may also be used if recommended by your dentist or health care provider.   Restricting the amount of sugary and starchy foods and sugary liquids you consume.   Avoiding frequent snacking on these foods and sipping of these liquids.   Keeping regular visits with a dentist for checkups and cleanings. PREVENTION   Practice good oral hygiene.  Consider a dental sealant. A dental sealant is a coating material that is applied by your dentist to the pits and grooves of teeth. The sealant prevents food from being trapped in them. It may protect the teeth for several years.  Ask about fluoride supplements if you live in a community without fluorinated water or with water that has a low fluoride content. Use fluoride supplements as directed by your dentist or health care provider.  Allow fluoride varnish applications to teeth if directed by your  dentist or health care provider. Document Released: 09/01/2002 Document Revised: 04/26/2014 Document Reviewed: 12/12/2012 [facility] Patient Information 2015 [place], [place]. This information is not intended to replace advice given to you by your health care provider. Make sure you discuss any questions you have with your health care provider.  Emergency Department Resource Guide 1) Find a Doctor and Pay Out of Pocket Although you won't have to find out who is covered by your insurance plan, it is a good idea to ask around and get recommendations. You will then need to call the office and see if the doctor you have chosen will accept you as a new patient and what types of options they offer for patients who are self-pay. Some doctors offer discounts or will set up payment plans for their patients who do not have insurance, but you will need to ask so you aren't surprised when you get to your appointment.  2) Contact Your Local Health Department Not all health departments have doctors that can see patients for sick visits, but many do, so it is worth a call to see if  yours does. If you don't know where your local health department is, you can check in your phone book. The CDC also has a tool to help you locate your state's health department, and many state websites also have listings of all of their local health departments.  3) Find a [facility] If your illness is not likely to be very severe or complicated, you may want to try a walk in clinic. These are popping up all over the country in pharmacies, drugstores, and shopping centers. They're usually staffed by nurse practitioners or physician assistants that have been trained to treat common illnesses and complaints. They're usually fairly quick and inexpensive. However, if you have serious medical issues or chronic medical problems, these are probably not your best option.  No Primary Care Doctor: - Call Health Connect at  [phone] - they can help  you locate a primary care doctor that  accepts your insurance, provides certain services, etc. - Physician Referral Service- [phone]  Chronic Pain Problems: Organization         Address  Phone   Notes  [facility]  [phone] Patients need to be referred by their primary care doctor.   Medication Assistance: Organization         Address  Phone   Notes  [facility] Medication [facility] [address] [place], [place] [address] [phone] --Must be a resident of [facility] -- Must have NO insurance coverage whatsoever (no Medicaid/ Medicare, etc.) -- The pt. MUST have a primary care doctor that directs their care regularly and follows them in the community   [facility]ist  [phone]   [employer]  [phone]    Agencies that provide inexpensive medical care: Organization         Address  Phone   Notes  [place]  [phone]   [facility]    [phone]   [facility] [place], [place] [address] [phone]   [place] [address] [place] [phone]   [facility]anned Parenthood    [phone]   [facility]    [phone]   [place] and [place] [address], [place] Phone:  [phone], Fax:  [phone] Hours of Operation:  9 am - 6 pm, M-F.  Also accepts Medicaid/Medicare and self-pay.  [facility] for [place], [address], [place] Phone: [phone], Fax: [phone]. Hours of Operation:  8:30 am - 5:30 pm, M-F.  Also accepts Medicaid and self-pay.  [facility] [place] [address] [place] Phone: [phone]   [place], [place], [place] [phone], Ext. [phone] Mondays & Thursdays: 7-9 AM.  First 15 patients are seen on a first come, first serve basis.    [place] Providers:  Organization         Address  Phone   Notes  [facility] [address], [place] [phone] Also accepts self-pay patients.  [place], [place]  [phone]   [place], [address], [place] [phone]   [place] [address] [place] [phone]   Lucianne Lei [address], [place]   [phone])  [phone] Only accepts [place] Access Medicaid patients after they have their name applied to their card.   Self-Pay (no insurance) in [facility]:  Organization         Address  Phone   Notes  Sickle Cell Patients, [facility] [place] [phone]   [facility] [place] [phone]   [facility] [place]  [place], [place], [place] [phone]   [facility]/Dr. Osei-Bonsu  [address], [place] or [address], [place] [phone] Phone number for both [place] and [place] locations is the same.  [facility] and [facility] [address] [place] [phone]   [facility] [address], [place] or [address] [phone]   [facility] [address] [place] [phone], phone; [phone], fax Sees patients 1st and 3rd Saturday of every month.  Must not qualify for public or private insurance (i.e. Medicaid, Medicare, [place] Health Choice, Veterans' Benefits)  Household income should be no more than 200% of the poverty level The clinic cannot treat you if you are pregnant or think you are pregnant  Sexually transmitted diseases are not treated at the clinic.    Dental Care: Organization         Address  Phone  Notes  [facility] Department of [facility] [phone] Accepts children up to age 42 who are enrolled in [place] or [place]; pregnant women with a Medicaid card; and children who have applied for Medicaid or [place] Health Choice, but were declined, whose parents can pay a reduced fee at time of service.  [facility] Department of [facility]  [address], [place] [phone] Accepts children up to age 25 who are enrolled in [place] or [place]; pregnant women with a Medicaid card; and children who have applied for Medicaid or [place] Health Choice, but were declined, whose parents can pay a reduced fee at time of service.  [place] Adult Dental Access PROGRAM  [place] [phone] Patients are seen by appointment only. Walk-ins are not accepted. [place] will see patients 12 years of age and older. Monday - Tuesday (8am-5pm) Most Wednesdays (8:30-5pm) $30 per visit, cash only  [facility] Adult Dental Access PROGRAM  [address], [facility] [phone] Patients are seen by appointment only. Walk-ins are not accepted. [place] will see patients 73 years of age and older. One Wednesday Evening (Monthly: Volunteer Based).  $30 per visit, cash only  [place]  [phone] for adults; Children under age 59, call Graduate Pediatric Dentistry at [phone]. Children aged 26-14, please call [phone] to request a pediatric application.  Dental services are provided in all areas of dental care including fillings, crowns and bridges, complete and partial dentures, implants, gum treatment, root canals, and extractions. Preventive care is also provided. Treatment is provided to both adults and children. Patients are selected via a lottery and there is often a waiting list.   [facility] [address], [place]  [phone] [email or web address], [place], [place] [phone], Ext. [phone] Second and Fourth Thursday of each month, opens at 6:30 AM;  Clinic ends at 9 AM.  Patients are seen on a first-come first-served basis, and a limited number are seen during each clinic.   [facility]  [address] [place] D[place]er [place], [place] [phone]   Eligibility Requirements You must have lived in [place], [place], or [place] counties for at least the last three months.   You cannot be eligible for state or federal sponsored [employer], including [employer], [place], or [employer].   You generally cannot be eligible for healthcare insurance through your employer.    How to apply: Eligibility screenings are held every Tuesday and Wednesday afternoon from 1:00 pm until 4:00 pm. You do not need an appointment for the interview!  [facility] [address], [place], [place]   [place]  [place]  [place]  [phone]

## 2015-06-30 ENCOUNTER — Encounter (HOSPITAL_COMMUNITY): Payer: Self-pay | Admitting: Emergency Medicine

## 2015-06-30 ENCOUNTER — Emergency Department (HOSPITAL_COMMUNITY)
Admission: EM | Admit: 2015-06-30 | Discharge: 2015-06-30 | Disposition: A | Discharge status: Home / Self Care | Payer: Self-pay | Attending: Emergency Medicine | Admitting: Emergency Medicine

## 2015-06-30 DIAGNOSIS — Z720 Tobacco use: Secondary | ICD-10-CM | POA: Insufficient documentation

## 2015-06-30 DIAGNOSIS — K029 Dental caries, unspecified: Secondary | ICD-10-CM | POA: Insufficient documentation

## 2015-06-30 DIAGNOSIS — Z792 Long term (current) use of antibiotics: Secondary | ICD-10-CM | POA: Insufficient documentation

## 2015-06-30 DIAGNOSIS — K088 Other specified disorders of teeth and supporting structures: Secondary | ICD-10-CM | POA: Insufficient documentation

## 2015-06-30 DIAGNOSIS — K0889 Other specified disorders of teeth and supporting structures: Secondary | ICD-10-CM

## 2015-06-30 MED ORDER — PENICILLIN V POTASSIUM 500 MG PO TABS: 500.0000 mg | ORAL_TABLET | Freq: Four times a day (QID) | ORAL | Status: AC

## 2015-06-30 MED ORDER — TRAMADOL HCL 50 MG PO TABS: 50.0000 mg | ORAL_TABLET | Freq: Four times a day (QID) | ORAL | Status: DC | PRN

## 2015-06-30 MED ORDER — IBUPROFEN 600 MG PO TABS: 600.0000 mg | ORAL_TABLET | Freq: Four times a day (QID) | ORAL | Status: DC | PRN

## 2015-06-30 MED ORDER — OXYCODONE-ACETAMINOPHEN 5-325 MG PO TABS
ORAL_TABLET | ORAL | Status: AC
  Filled 2015-06-30: qty 1

## 2015-06-30 MED ORDER — OXYCODONE-ACETAMINOPHEN 5-325 MG PO TABS
1.0000 | ORAL_TABLET | Freq: Once | ORAL | Status: AC
  Administered 2015-06-30: 1 via ORAL

## 2015-06-30 NOTE — ED Notes (Signed)
Pt states he has intermittent dental pain to bilateral upper molars for 8 years that has gotten worse a day ago. 9/10 pain. No relief with Ibuprofen. Pt also reports a headache to the top of his head for about a day.

## 2015-06-30 NOTE — ED Provider Notes (Signed)
CSN: [id number]     Arrival date & time 06/30/15  2225 History  This chart was scribed for non-physician provider Jeannett Senior, PA-C, working with Ernestina Patches, MD by Irene Pap, ED Scribe. This patient was seen in room TR10C/TR10C and patient care was started at 11:00 PM.    No chief complaint on file.  The history is provided by the patient. No language interpreter was used.  HPI Comments: Dennis Pope is a 29 y.o. male who presents to the Emergency Department complaining of intermittent worsening dental pain to bilateral upper molars and headache onset one day ago. Pt states that the dental pain has been coming and going for the past 8 years but worsened yesterday. Currently rates pain 9/10. He reports associated headache on the top of his head. Pt states that he cannot afford to see a dentist. He denies fever, chills, nausea, or vomiting.,   History reviewed. No pertinent past medical history. No past surgical history on file. Family History  Problem Relation Age of Onset  . Diabetes Mother   . Diabetes Father    History  Substance Use Topics  . Smoking status: Current Every Day Smoker -- 0.20 packs/day    Types: Cigars  . Smokeless tobacco: Not on file  . Alcohol Use: No    Review of Systems  Constitutional: Negative for fever and chills.  HENT: Positive for dental problem.   Gastrointestinal: Negative for nausea and vomiting.  Neurological: Positive for headaches.   Allergies  Review of patient's allergies indicates no known allergies.  Home Medications   Prior to Admission medications   Medication Sig Start Date End Date Taking? Authorizing Provider  amoxicillin (AMOXIL) 500 MG capsule Take 1 capsule (500 mg total) by mouth 3 (three) times daily. 01/18/15   Janne Napoleon, NP  traMADol (ULTRAM) 50 MG tablet 1-2 tabs po q 6 hr prn pain Maximum dose= 8 tablets per day 01/18/15   Janne Napoleon, NP   BP 116/79 mmHg  Pulse 63  Temp(Src) 98.3 F (36.8 C) (Oral)  Resp  16  Ht 5\' 10"  (1.778 m)  Wt 152 lb 11.2 oz (69.264 kg)  BMI 21.91 kg/m2  SpO2 99%  Physical Exam  Constitutional: He is oriented to person, place, and time. He appears well-developed and well-nourished.  HENT:  Head: Normocephalic and atraumatic.  Decay of bilateral 3rd upper molar with erosion to the gum line. Gum swelling and ttp over bilateral upper 2nd and 3rd molars and gums. No trismus. No facial swelling.   Eyes: EOM are normal.  Neck: Normal range of motion. Neck supple.  Cardiovascular: Normal rate.   Pulmonary/Chest: Effort normal.  Musculoskeletal: Normal range of motion.  Neurological: He is alert and oriented to person, place, and time.  Skin: Skin is warm and dry.  Psychiatric: He has a normal mood and affect. His behavior is normal.  Nursing note and vitals reviewed.   ED Course  Procedures (including critical care time) DIAGNOSTIC STUDIES: Oxygen Saturation is 99% on RA, normal by my interpretation.    COORDINATION OF CARE: 11:01 PM-Discussed treatment plan which includes anti-biotics and pain medication; dental resources with pt at bedside and pt agreed to plan; pt states that he drove himself to the ED.   Labs Review Labs Reviewed - No data to display  Imaging Review No results found.   EKG Interpretation None      MDM   Final diagnoses:  Pain, dental    Pt with dental pain, carries  on exam, ttp. No obvious abscess. No facial swelling. No fever. Will start on ibuprofen, tramadol, amoxil. Follow up with a dentist. Referrals provided.   Filed Vitals:   06/30/15 2232  BP: 116/79  Pulse: 63  Temp: 98.3 F (36.8 C)  TempSrc: Oral  Resp: 16  Height: 5\' 10"  (1.778 m)  Weight: 152 lb 11.2 oz (69.264 kg)  SpO2: 99%    I personally performed the services described in this documentation, which was scribed in my presence. The recorded information has been reviewed and is accurate.    Jeannett Senior, PA-C 07/01/15 0111  Varney Biles,  MD 07/02/15 [phone]

## 2015-06-30 NOTE — Discharge Instructions (Signed)
Penicillin until all gone.  Ibuprofen for pain. Tramadol for severe pain. Follow up with a dentist. See lists provided.    Dental Pain A tooth ache may be caused by cavities (tooth decay). Cavities expose the nerve of the tooth to air and hot or cold temperatures. It may come from an infection or abscess (also called a boil or furuncle) around your tooth. It is also often caused by dental caries (tooth decay). This causes the pain you are having. DIAGNOSIS  Your caregiver can diagnose this problem by exam. TREATMENT   If caused by an infection, it may be treated with medications which kill germs (antibiotics) and pain medications as prescribed by your caregiver. Take medications as directed.  Only take over-the-counter or prescription medicines for pain, discomfort, or fever as directed by your caregiver.  Whether the tooth ache today is caused by infection or dental disease, you should see your dentist as soon as possible for further care. SEEK MEDICAL CARE IF: The exam and treatment you received today has been provided on an emergency basis only. This is not a substitute for complete medical or dental care. If your problem worsens or new problems (symptoms) appear, and you are unable to meet with your dentist, call or return to this location. SEEK IMMEDIATE MEDICAL CARE IF:   You have a fever.  You develop redness and swelling of your face, jaw, or neck.  You are unable to open your mouth.  You have severe pain uncontrolled by pain medicine. MAKE SURE YOU:   Understand these instructions.  Will watch your condition.  Will get help right away if you are not doing well or get worse. Document Released: 12/10/2005 Document Revised: 03/03/2012 Document Reviewed: 07/28/2008 [facility] Patient Information 2015 [place], [place]. This information is not intended to replace advice given to you by your health care provider. Make sure you discuss any questions you have with your health care  provider.   Emergency Department Resource Guide 1) Find a Doctor and Pay Out of Pocket Although you won't have to find out who is covered by your insurance plan, it is a good idea to ask around and get recommendations. You will then need to call the office and see if the doctor you have chosen will accept you as a new patient and what types of options they offer for patients who are self-pay. Some doctors offer discounts or will set up payment plans for their patients who do not have insurance, but you will need to ask so you aren't surprised when you get to your appointment.  2) Contact Your Local Health Department Not all health departments have doctors that can see patients for sick visits, but many do, so it is worth a call to see if yours does. If you don't know where your local health department is, you can check in your phone book. The CDC also has a tool to help you locate your state's health department, and many state websites also have listings of all of their local health departments.  3) Find a [facility] If your illness is not likely to be very severe or complicated, you may want to try a walk in clinic. These are popping up all over the country in pharmacies, drugstores, and shopping centers. They're usually staffed by nurse practitioners or physician assistants that have been trained to treat common illnesses and complaints. They're usually fairly quick and inexpensive. However, if you have serious medical issues or chronic medical problems, these are probably  not your best option.  No Primary Care Doctor: - Call Health Connect at  [phone] - they can help you locate a primary care doctor that  accepts your insurance, provides certain services, etc. - Physician Referral Service- [phone]  Chronic Pain Problems: Organization         Address  Phone   Notes  [facility]  [phone] Patients need to be referred by their primary care doctor.    Medication Assistance: Organization         Address  Phone   Notes  [facility] Medication [facility] [address] [place], [place] [address] [phone] --Must be a resident of [facility] -- Must have NO insurance coverage whatsoever (no Medicaid/ Medicare, etc.) -- The pt. MUST have a primary care doctor that directs their care regularly and follows them in the community   MedAssist  [phone]   [employer]  [phone]    Agencies that provide inexpensive medical care: Organization         Address  Phone   Notes  [place]  [phone]   [facility]    [phone]   [facility] [place], [place] [address] [phone]   [place] [address], [place] [phone]   Planned Parenthood    [phone]   [facility]    [phone]   [place] and [place] [address], [place] Phone:  [phone], Fax:  [phone] Hours of Operation:  9 am - 6 pm, M-F.  Also accepts Medicaid/Medicare and self-pay.  [facility] for [place], [address], [place] Phone: [phone], Fax: [phone]. Hours of Operation:  8:30 am - 5:30 pm, M-F.  Also accepts Medicaid and self-pay.  [facility] [place] [address] [place] Phone: [phone]   [place], [place], [place] [phone], Ext. [phone] Mondays & Thursdays: 7-9 AM.  First 15 patients are seen on a first come, first serve basis.    [place] Providers:  Organization         Address  Phone   Notes  [facility] [address], [place] [phone] Also accepts self-pay patients.  [facility] [address], [place]  [phone]   [place], [address], [place] [phone]   [facility] [address] [place] [phone]   Lucianne Lei [address], [place]   [phone] Only accepts [place] Access [place] patients after they have their name applied to their card.   Self-Pay (no insurance) in [facility]:  Organization         Address  Phone   Notes  Sickle Cell Patients, [facility] Internal Medicine [place] [phone]   [facility] [place] [phone]   [facility] [place]  [place], [place], [place] [phone]   [facility]/Dr. Osei-Bonsu  [address], [place] or [address], [place] [phone] Phone number for both [place] and [place] locations is the same.  [facility] [address], [place] [phone]   [facility] [address] [place] or [address] [phone]   [facility] [address] [place] [phone], phone; [phone], fax Sees patients 1st and 3rd Saturday of every month.  Must not qualify for public or private insurance (i.e. Medicaid, Medicare, [place] Health Choice, Veterans' Benefits)  Household income should be no more than 200% of the poverty level The clinic cannot treat you if you are pregnant or think you are pregnant  Sexually transmitted diseases are not treated at the clinic.    Dental Care: Organization         Address  Phone  Notes  [facility] of [facility] [place] [phone] Accepts children up to age 78 who are enrolled in [place] or [place]; pregnant women with a Medicaid card; and children who have applied for Medicaid or [place] Health Choice, but were declined, whose parents can pay a reduced fee at time of service.    [facility] Department of [facility]  [address], [place] [phone] Accepts children up to age 45 who are enrolled in [place] or [place]; pregnant women with a Medicaid card; and children who have applied for Medicaid or [place] Health Choice, but were declined, whose parents can pay a reduced fee at time of service.  [facility] Adult [facility] Access PROGRAM  [place] [phone] Patients are seen by appointment only. Walk-ins are not accepted. [place] will see patients 60 years of age and older. Monday - Tuesday (8am-5pm) Most Wednesdays (8:30-5pm) $30 per visit, cash only  [facility] Adult Dental Access PROGRAM  [address], [facility] [phone] Patients are seen by appointment only. Walk-ins are not accepted. [place] will see patients 94 years of age and older. One Wednesday Evening (Monthly: Volunteer Based).  $30 per visit, cash only  [place]  [phone] for adults; Children under age 60, call Graduate Pediatric Dentistry at [phone]. Children aged 49-14, please call [phone] to request a pediatric application.  Dental services are provided in all areas of dental care including fillings, crowns and bridges, complete and partial dentures, implants, gum treatment, root canals, and extractions. Preventive care is also provided. Treatment is provided to both adults and children. Patients are selected via a lottery and there is often a waiting list.   [facility] [address], [place]  [phone] [email or web address]   [facility] [address] [place], [place] [phone], Ext. [phone] Second and Fourth Thursday of each month, opens at 6:30 AM; Clinic ends at 9 AM.  Patients are seen on a first-come first-served basis, and a limited number are seen during each clinic.   [facility]  [address] [place], [place] [phone]   Eligibility Requirements You must have lived in [place], [place], or [place] counties for at least the last three months.   You cannot be eligible for state or federal sponsored [employer], including [employer], [place], or [employer].   You generally cannot be eligible for healthcare insurance through your employer.    How to apply: Eligibility screenings are held every Tuesday and Wednesday afternoon from 1:00 pm until 4:00 pm. You do not need an appointment for the interview!  [facility]  [address] [place], [place]   [place]  [place] Department  [place]  [phone]    Behavioral Health Resources in the Community: Intensive Outpatient Programs Organization         Address  Phone  Notes  [place]. [address] [place], [place] [phone]   [facility] Outpatient [address] [place], [place]   ADS: Alcohol & Drug Svcs [address] [place], [place]   [place] [address]  [place], [place] or [phone]   Substance Abuse Resources Organization         Address  Phone  Notes  Alcohol and Drug Services  [phone]   [place]  [phone]   The [place]   Chinita Pester  [phone]   Residential & Outpatient Substance Abuse Program  [phone]   Psychological Services Organization         Address  Phone  Notes  [facility] [place]  [place]  [phone]   [place] [address], [place] or [phone]    Mobile Crisis Teams Organization         Address  Phone  Notes  [facility], Mobile Crisis Care Unit  [phone]   [facility]ive Psychotherapeutic Services  [address] [place], [place]   [place] [address], [place] [phone]    Self-Help/Support Groups Organization         Address  Phone             Notes  [place]. of [place] - variety of support groups  [place] Call for more information  Narcotics Anonymous (NA), Caring Services [address], [employer] [place]  2 meetings at this location   Special educational needs teacher         Address  Phone  Notes  ASAP Residential Treatment [place],    [place]  [phone]   [facility]  [address] [place] [id number], [place], [place]   [place], [place] [phone] Admissions: 8am-3pm M-F  Incentives Substance [place] [address],    [place], [place] [phone]   [facility] [address] [place], [place], [place]   [facility] [address],  [place], [place]   [facility] Programs - Intensive Outpatient [address]een [address], [place], [place]   [facility] [place].,  [place], [place] [phone] or [phone]   [facility]idential Treatment Services (RTS) [address], [place], [place] Accepts Medicaid  [facility] [place] [address],  [place] [phone] Substance Abuse/Addiction Treatment   [facility] Organization         Address  Phone  Notes  [facility]  [phone]   Domenic Schwab, PhD [address] Arlis [place], [place]   [phone] or [phone]   [place], [place] [phone]   [place] [address] [place], [place] [phone] Insurance/Medicaid/sponsorship through [employer] and Families [address] [place] [address]  [place], [place] [phone]  [facility] [place], [place] [phone]    Dr. Adele Schilder  [phone]   [facility] [place] Dept. 1) [address] [place] 2) [place] 3)  [place] [address], [place] [phone]  [phone]   [place] [phone] or [phone] (After Hours)

## 2015-07-08 ENCOUNTER — Emergency Department (HOSPITAL_COMMUNITY)
Admission: EM | Admit: 2015-07-08 | Discharge: 2015-07-09 | Disposition: A | Discharge status: Home / Self Care | Payer: Self-pay | Attending: Emergency Medicine | Admitting: Emergency Medicine

## 2015-07-08 ENCOUNTER — Encounter (HOSPITAL_COMMUNITY): Payer: Self-pay | Admitting: Emergency Medicine

## 2015-07-08 DIAGNOSIS — R519 Headache, unspecified: Secondary | ICD-10-CM

## 2015-07-08 DIAGNOSIS — K0889 Other specified disorders of teeth and supporting structures: Secondary | ICD-10-CM

## 2015-07-08 DIAGNOSIS — K029 Dental caries, unspecified: Secondary | ICD-10-CM | POA: Insufficient documentation

## 2015-07-08 DIAGNOSIS — R51 Headache: Secondary | ICD-10-CM | POA: Insufficient documentation

## 2015-07-08 DIAGNOSIS — Z792 Long term (current) use of antibiotics: Secondary | ICD-10-CM | POA: Insufficient documentation

## 2015-07-08 DIAGNOSIS — H538 Other visual disturbances: Secondary | ICD-10-CM | POA: Insufficient documentation

## 2015-07-08 DIAGNOSIS — K088 Other specified disorders of teeth and supporting structures: Secondary | ICD-10-CM | POA: Insufficient documentation

## 2015-07-08 DIAGNOSIS — Z720 Tobacco use: Secondary | ICD-10-CM | POA: Insufficient documentation

## 2015-07-08 DIAGNOSIS — H539 Unspecified visual disturbance: Secondary | ICD-10-CM

## 2015-07-08 MED ORDER — KETOROLAC TROMETHAMINE 60 MG/2ML IM SOLN
60.0000 mg | Freq: Once | INTRAMUSCULAR | Status: AC
  Administered 2015-07-08: 60 mg via INTRAMUSCULAR
  Filled 2015-07-08: qty 2

## 2015-07-08 MED ORDER — MORPHINE SULFATE 4 MG/ML IJ SOLN
4.0000 mg | Freq: Once | INTRAMUSCULAR | Status: AC
  Administered 2015-07-08: 4 mg via INTRAMUSCULAR
  Filled 2015-07-08: qty 1

## 2015-07-08 MED ORDER — ONDANSETRON 4 MG PO TBDP
4.0000 mg | ORAL_TABLET | Freq: Once | ORAL | Status: AC
  Administered 2015-07-08: 4 mg via ORAL
  Filled 2015-07-08: qty 1

## 2015-07-08 NOTE — ED Provider Notes (Signed)
CSN: [id number]     Arrival date & time 07/08/15  2329 History   This chart was scribed for Delos Haring, PA-C working with Everlene Balls, MD by Randa Evens, ED Scribe. This patient was seen in room TR08C/TR08C and the patient's care was started at 11:36 PM.      Chief Complaint  Patient presents with  . Dental Pain    The patient said he was seen here for the same thing.  He was given antibiotics and discharged.  He says it has gotten worse.     Patient is a 29 y.o. male presenting with tooth pain. The history is provided by the patient. No language interpreter was used.  Dental Pain Associated symptoms: no fever    HPI Comments: Dennis Pope is a 29 y.o. male who presents to the Emergency Department complaining of worsening bilateral posterior upper back pain onset 1 week prior. Pt states that the pain was so severe tonight that he was having trouble seeing due to the pain being so severe. His vision is not black but it is extremely blurry with a headache. The headache started gradually and is now severe, the blurry vision is bilateral. He denies any neck pain. Pt was seen 1 week prior for similar pain and was prescribed antibiotics that hasn't provided any relief. Pt has also been taking tramadol with no relief. Pt denies fever, nausea or vomiting.   Blood pressure 143/87, pulse 78, temperature 97.9 F (36.6 C), temperature source Oral, resp. rate 22, SpO2 100 %. diaphoresis, fever, headache, weakness (general or focal), confusion, change of vision,  neck pain, dysphagia, aphagia, chest pain, shortness of breath,  back pain, abdominal pains, nausea, vomiting, diarrhea, lower extremity swelling, rash.      History reviewed. No pertinent past medical history. History reviewed. No pertinent past surgical history. Family History  Problem Relation Age of Onset  . Diabetes Mother   . Diabetes Father    History  Substance Use Topics  . Smoking status: Current Every Day Smoker --  0.20 packs/day    Types: Cigars  . Smokeless tobacco: Not on file  . Alcohol Use: No    Review of Systems  Constitutional: Negative for fever.  HENT: Positive for dental problem.   Gastrointestinal: Negative for nausea and vomiting.     Allergies  Review of patient's allergies indicates no known allergies.  Home Medications   Prior to Admission medications   Medication Sig Start Date End Date Taking? Authorizing Provider  amoxicillin (AMOXIL) 500 MG capsule Take 1 capsule (500 mg total) by mouth 3 (three) times daily. 01/18/15   Janne Napoleon, NP  clindamycin (CLEOCIN) 150 MG capsule Take 1 capsule (150 mg total) by mouth every 6 (six) hours. 07/09/15   Isaura Schiller Carlota Raspberry, PA-C  ibuprofen (ADVIL,MOTRIN) 600 MG tablet Take 1 tablet (600 mg total) by mouth every 6 (six) hours as needed. 06/30/15   Tatyana Kirichenko, PA-C  oxyCODONE-acetaminophen (PERCOCET/ROXICET) 5-325 MG per tablet Take 1-2 tablets by mouth every 6 (six) hours as needed. 07/09/15   Milicent Acheampong Carlota Raspberry, PA-C  traMADol (ULTRAM) 50 MG tablet 1-2 tabs po q 6 hr prn pain Maximum dose= 8 tablets per day 01/18/15   Janne Napoleon, NP  traMADol (ULTRAM) 50 MG tablet Take 1 tablet (50 mg total) by mouth every 6 (six) hours as needed. 06/30/15   Tatyana Kirichenko, PA-C   BP 143/87 mmHg  Pulse 78  Temp(Src) 97.9 F (36.6 C) (Oral)  Resp 22  SpO2 100%  Physical Exam  Constitutional: He is oriented to person, place, and time. He appears well-developed and well-nourished. He appears distressed (pt is hyperventilating and will not answer questions due to the severity of his pain).  HENT:  Head: Normocephalic and atraumatic.  Mouth/Throat: No trismus in the jaw. No dental abscesses or lacerations.  Widespread dental decay. THe patients upper left backmost molars are all broken past the point of the gumline, he also has one on the bottom back left, bottom back right and upper right, they are all broken past the gumline. No obvious abscess.no  bleeding. No abnormality to tongue sweep or protruding tongue.  Eyes: Conjunctivae and EOM are normal.  Neck: Neck supple. No tracheal deviation present.  Cardiovascular: Normal rate.   Pulmonary/Chest: Effort normal. No respiratory distress.  Musculoskeletal: Normal range of motion.  Neurological: He is alert and oriented to person, place, and time.  Pt alert and oriented x 3 Upper and lower extremity strength is symmetrical and physiologic Normal muscular tone No facial droop Coordination intact, no limb ataxia, finger-nose-finger normal Rapid alternating movements normal     Skin: Skin is warm and dry.  Psychiatric: He has a normal mood and affect. His behavior is normal.  Nursing note and vitals reviewed.   ED Course  Procedures (including critical care time) DIAGNOSTIC STUDIES: Oxygen Saturation is 100% on RA, normal by my interpretation.    COORDINATION OF CARE: 11:46 PM-Discussed treatment plan with pt at bedside and pt agreed to plan.     Labs Review Labs Reviewed  I-STAT CHEM 8, ED - Abnormal; Notable for the following:    Glucose, Bld 110 (*)    All other components within normal limits    Imaging Review Ct Head Wo Contrast  07/09/2015   CLINICAL DATA:  29 year old male with foot pain and headache, blurry vision.  EXAM: CT HEAD WITHOUT CONTRAST  TECHNIQUE: Contiguous axial images were obtained from the base of the skull through the vertex without intravenous contrast.  COMPARISON:  None.  FINDINGS: The ventricles and the sulci are appropriate in size for the patient's age. There is no intracranial hemorrhage. No midline shift or mass effect identified. The gray-white matter differentiation is preserved. A 1.4 x 0.5 cm triangular hypodense area is noted in the left cerebellum likely related to an old insult with encephalomalacia. Clinical correlation is recommended. MRI may provide better evaluation if clinically indicated.  The visualized paranasal sinuses and mastoid  air cells are well aerated. The calvarium is intact.  IMPRESSION: No acute intracranial pathology.  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 Clinical correlation is recommended.   Electronically Signed   By: Anner Crete M.D.   On: 07/09/2015 01:53     EKG Interpretation None      MDM   Final diagnoses:  Vision changes  Headache  Pain, dental   Patient is complaining of blurry vision associated with headache secondary to dental pain. As the pain improves, the patients blurry vision improves. CT head was done due to severity of blurry vision on presentation.  Presentation is non concerning for [facility], [facility], Meningitis, or temporal arteritis. Pt is afebrile with no  nuchal rigidity. The patient denies any symptoms of neurological impairment or TIA's; no amaurosis, diplopia, dysphasia, or unilateral disturbance of motor or sensory function. No loss of balance or vertigo.  .his dental concerns will require a maxillofacial surgeon, no trismus or ludwigs angina.  His pain was not completely resolved but did  improve significantly during his stay. Amoxicillin and Ultram discontinues, he is to take Clindamycin and Percocet instead.   Medications  oxyCODONE-acetaminophen (PERCOCET/ROXICET) 5-325 MG per tablet 1 tablet (not administered)  ondansetron (ZOFRAN-ODT) disintegrating tablet 4 mg (not administered)  ketorolac (TORADOL) injection 60 mg (60 mg Intramuscular Given 07/08/15 2348)  ondansetron (ZOFRAN-ODT) disintegrating tablet 4 mg (4 mg Oral Given 07/08/15 2348)  morphine 4 MG/ML injection 4 mg (4 mg Intramuscular Given 07/08/15 2347)  bupivacaine (PF) (MARCAINE) 0.25 % injection 5 mL (5 mLs Infiltration Given 07/09/15 0049)    29 y.o.Lavonia Drafts Bobb's evaluation in the Emergency Department is complete. It has been determined that no acute conditions requiring further emergency intervention are present at this time. The patient/guardian have  been advised of the diagnosis and plan. We have discussed signs and symptoms that warrant return to the ED, such as changes or worsening in symptoms.  Vital signs are stable at discharge. Filed Vitals:   07/08/15 2337  BP: 143/87  Pulse: 78  Temp: 97.9 F (36.6 C)  Resp: 22    Patient/guardian has voiced understanding and agreed to follow-up with the PCP or specialist.    I personally performed the services described in this documentation, which was scribed in my presence. The recorded information has been reviewed and is accurate.     Delos Haring, PA-C 07/10/15 [place], MD 07/10/15 1346

## 2015-07-08 NOTE — ED Notes (Signed)
The patient said he was seen here for the same thing.  He was given antibiotics and discharged.  He says it has gotten worse.  He rates his pain 10/10.  He says it is on both side of his mouth.

## 2015-07-09 ENCOUNTER — Emergency Department (HOSPITAL_COMMUNITY): Payer: Self-pay

## 2015-07-09 LAB — I-STAT CHEM 8, ED
BUN: 14 mg/dL (ref 6–20)
CHLORIDE: 102 mmol/L (ref 101–111)
CREATININE: 1 mg/dL (ref 0.61–1.24)
Calcium, Ion: 1.18 mmol/L (ref 1.12–1.23)
Glucose, Bld: 110 mg/dL — ABNORMAL HIGH (ref 65–99)
HCT: 48 % (ref 39.0–52.0)
Hemoglobin: 16.3 g/dL (ref 13.0–17.0)
Potassium: 4.7 mmol/L (ref 3.5–5.1)
SODIUM: 137 mmol/L (ref 135–145)
TCO2: 24 mmol/L (ref 0–100)

## 2015-07-09 MED ORDER — BUPIVACAINE HCL (PF) 0.25 % IJ SOLN
5.0000 mL | Freq: Once | INTRAMUSCULAR | Status: AC
  Administered 2015-07-09: 5 mL
  Filled 2015-07-09: qty 10

## 2015-07-09 MED ORDER — OXYCODONE-ACETAMINOPHEN 5-325 MG PO TABS
1.0000 | ORAL_TABLET | Freq: Four times a day (QID) | ORAL | Status: DC | PRN
Start: 2015-07-09 — End: 2015-11-25

## 2015-07-09 MED ORDER — OXYCODONE-ACETAMINOPHEN 5-325 MG PO TABS
1.0000 | ORAL_TABLET | Freq: Once | ORAL | Status: AC
  Administered 2015-07-09: 1 via ORAL
  Filled 2015-07-09: qty 1

## 2015-07-09 MED ORDER — ONDANSETRON 4 MG PO TBDP
4.0000 mg | ORAL_TABLET | Freq: Once | ORAL | Status: AC
  Administered 2015-07-09: 4 mg via ORAL
  Filled 2015-07-09: qty 1

## 2015-07-09 MED ORDER — CLINDAMYCIN HCL 150 MG PO CAPS: 150.0000 mg | ORAL_CAPSULE | Freq: Four times a day (QID) | ORAL | Status: DC

## 2015-07-09 NOTE — Discharge Instructions (Signed)

## 2015-11-20 ENCOUNTER — Emergency Department (HOSPITAL_COMMUNITY)
Admission: EM | Admit: 2015-11-20 | Discharge: 2015-11-20 | Disposition: A | Discharge status: Home / Self Care | Payer: Self-pay | Attending: Emergency Medicine | Admitting: Emergency Medicine

## 2015-11-20 ENCOUNTER — Encounter (HOSPITAL_COMMUNITY): Payer: Self-pay | Admitting: Nurse Practitioner

## 2015-11-20 DIAGNOSIS — K0889 Other specified disorders of teeth and supporting structures: Secondary | ICD-10-CM | POA: Insufficient documentation

## 2015-11-20 DIAGNOSIS — F17200 Nicotine dependence, unspecified, uncomplicated: Secondary | ICD-10-CM | POA: Insufficient documentation

## 2015-11-20 DIAGNOSIS — Z792 Long term (current) use of antibiotics: Secondary | ICD-10-CM | POA: Insufficient documentation

## 2015-11-20 MED ORDER — BUPIVACAINE-EPINEPHRINE (PF) 0.5% -1:200000 IJ SOLN
1.8000 mL | Freq: Once | INTRAMUSCULAR | Status: AC
  Administered 2015-11-20: 1.8 mL

## 2015-11-20 MED ORDER — PENICILLIN V POTASSIUM 500 MG PO TABS: 500.0000 mg | ORAL_TABLET | Freq: Four times a day (QID) | ORAL | Status: AC

## 2015-11-20 MED ORDER — IBUPROFEN 600 MG PO TABS: 600.0000 mg | ORAL_TABLET | Freq: Four times a day (QID) | ORAL | Status: DC | PRN

## 2015-11-20 NOTE — ED Provider Notes (Signed)
CSN: [id number]     Arrival date & time 11/20/15  1150 History  By signing my name below, I, Starleen Arms, attest that this documentation has been prepared under the direction and in the presence of [employer], PA-C. Electronically Signed: Starleen Arms ED Scribe. 11/20/2015. 12:26 PM.    Chief Complaint  Patient presents with  . Dental Pain   The history is provided by the patient. No language interpreter was used.   HPI Comments: Dennis Pope is a 29 y.o. male who presents to the Emergency Department complaining of constant, worsening bilateral upper dental pain onset yesterday; unrelieved by ibuprofen.  Patient reports he has not been seen by a dentist for financial reasons.  Patient denies fever, trouble swallowing, trismus.   History reviewed. No pertinent past medical history. History reviewed. No pertinent past surgical history. Family History  Problem Relation Age of Onset  . Diabetes Mother   . Diabetes Father    Social History  Substance Use Topics  . Smoking status: Current Every Day Smoker -- 0.20 packs/day    Types: Cigars  . Smokeless tobacco: None  . Alcohol Use: No    Review of Systems  A complete 10 system review of systems was obtained and all systems are negative except as noted in the HPI and PMH.   Allergies  Review of patient's allergies indicates no known allergies.  Home Medications   Prior to Admission medications   Medication Sig Start Date End Date Taking? Authorizing Provider  amoxicillin (AMOXIL) 500 MG capsule Take 1 capsule (500 mg total) by mouth 3 (three) times daily. 01/18/15   Janne Napoleon, NP  clindamycin (CLEOCIN) 150 MG capsule Take 1 capsule (150 mg total) by mouth every 6 (six) hours. 07/09/15   Tiffany Carlota Raspberry, PA-C  ibuprofen (ADVIL,MOTRIN) 600 MG tablet Take 1 tablet (600 mg total) by mouth every 6 (six) hours as needed. 11/20/15   Comer Locket, PA-C  oxyCODONE-acetaminophen (PERCOCET/ROXICET) 5-325 MG per tablet Take 1-2  tablets by mouth every 6 (six) hours as needed. 07/09/15   Delos Haring, PA-C  penicillin v potassium (VEETID) 500 MG tablet Take 1 tablet (500 mg total) by mouth 4 (four) times daily. 11/20/15 11/27/15  Comer Locket, PA-C  traMADol (ULTRAM) 50 MG tablet 1-2 tabs po q 6 hr prn pain Maximum dose= 8 tablets per day 01/18/15   Janne Napoleon, NP  traMADol (ULTRAM) 50 MG tablet Take 1 tablet (50 mg total) by mouth every 6 (six) hours as needed. 06/30/15   Tatyana Kirichenko, PA-C   BP 118/86 mmHg  Pulse 69  Temp(Src) 98.3 F (36.8 C) (Oral)  Resp 16  Ht 5\' 10"  (1.778 m)  Wt 71.442 kg  BMI 22.60 kg/m2  SpO2 100% Physical Exam  Constitutional: He is oriented to person, place, and time. He appears well-developed and well-nourished. No distress.  Awake, alert, nontoxic appearance.  HENT:  Head: Normocephalic and atraumatic.  Dental pain located to bilateral maxillary molars. No evidence of abscess. Overall appropriate dentition. No glossal elevation. No trismus. Tolerating secretions well.  Eyes: Conjunctivae and EOM are normal. Right eye exhibits no discharge. Left eye exhibits no discharge.  Neck: Neck supple. No tracheal deviation present.  Cardiovascular: Normal rate, regular rhythm and normal heart sounds.   Pulmonary/Chest: Effort normal. No respiratory distress. He exhibits no tenderness.  Abdominal: Soft. There is no tenderness. There is no rebound.  Musculoskeletal: Normal range of motion. He exhibits no tenderness.  Baseline ROM, no obvious new focal weakness.  Neurological: He is alert and oriented to person, place, and time.  Mental status and motor strength appears baseline for patient and situation.  Skin: Skin is warm and dry. No rash noted.  Psychiatric: He has a normal mood and affect. His behavior is normal.  Nursing note and vitals reviewed.   ED Course  Procedures (including critical care time) NERVE BLOCK Performed by: Verl Dicker Consent: Verbal consent  obtained. Required items: required blood products, implants, devices, and special equipment available Time out: Immediately prior to procedure a "time out" was called to verify the correct patient, procedure, equipment, support staff and site/side marked as required.  Indication: Dental pain  Nerve block body site: Left superior alveolar   Preparation: Patient was prepped and draped in the usual sterile fashion. Needle gauge: 24 G Location technique: anatomical landmarks  Local anesthetic: Bupivacaine   Anesthetic total: 1.8 ml  Outcome: pain improved Patient tolerance: Patient tolerated the procedure well with no immediate complications.   NERVE BLOCK Performed by: Verl Dicker Consent: Verbal consent obtained. Required items: required blood products, implants, devices, and special equipment available Time out: Immediately prior to procedure a "time out" was called to verify the correct patient, procedure, equipment, support staff and site/side marked as required.  Indication: Dental pain  Nerve block body site: Right superior alveolar   Preparation: Patient was prepped and draped in the usual sterile fashion. Needle gauge: 24 G Location technique: anatomical landmarks  Local anesthetic: Bupivacaine   Anesthetic total: 1.8 ml  Outcome: pain improved Patient tolerance: Patient tolerated the procedure well with no immediate complications.   DIAGNOSTIC STUDIES: Oxygen Saturation is 100% on RA, normal by my interpretation.    COORDINATION OF CARE:  12:23 PM Will prescribe antibiotic and perform dental block.  Patient should continue to use Motrin.  Patient should f/u with dentist.  Will provide dental guidebook.  Patient acknowledges and agrees with plan.    Labs Review Labs Reviewed - No data to display  Imaging Review No results found. I have personally reviewed and evaluated these images and lab results as part of my medical decision-making.   EKG  Interpretation None     Filed Vitals:   11/20/15 1202  BP: 118/86  Pulse: 69  Temp: 98.3 F (36.8 C)  TempSrc: Oral  Resp: 16  Height: 5\' 10"  (1.778 m)  Weight: 71.442 kg  SpO2: 100%   Meds given in ED:  Medications  bupivacaine-epinephrine (MARCAINE W/ EPI) 0.5% -1:200000 injection 1.8 mL (1.8 mLs Infiltration Given by Other 11/20/15 1245)  bupivacaine-epinephrine (MARCAINE W/ EPI) 0.5% -1:200000 injection 1.8 mL (1.8 mLs Infiltration Given by Other 11/20/15 1245)    New Prescriptions   PENICILLIN V POTASSIUM (VEETID) 500 MG TABLET    Take 1 tablet (500 mg total) by mouth 4 (four) times daily.     MDM  Dennis Pope is a 29 y.o. male here for evaluation of dental pain. There is no evidence of focal abscess, Ludwig angina, retropharyngeal or peritonsillar abscess. No trismus, glossal elevation, drooling. Patient maintains a patent airway. Is hemodynamically stable, normal vital signs and afebrile. Relief provided with oral nerve block. Discharge with Motrin and antibiotics. Outpatient resources given for dentistry as well as referral to dentistry on call. Patient verbalizes understanding and agrees with this plan. Strict return process given. Patient appears well and is appropriate for discharge. Final diagnoses:  Pain, dental    I personally performed the services described in this documentation, which was scribed  in my presence. The recorded information has been reviewed and is accurate.    Comer Locket, PA-C 11/20/15 1311  Carmin Muskrat, MD 11/26/15 [phone]

## 2015-11-20 NOTE — Discharge Instructions (Signed)
You were evaluated in the ED today for your dental pain are there does not appear to be an emergent cause her symptoms at this time. There is no visible abscess. Please take your medications as prescribed. Continue taking Motrin for your discomfort. Follow-up with Dr. Radford Pax, dentistry for definitive care. He may also use the attached resources in order to find other dentist in the area. Return to ED for any new or worsening symptoms.  [facility]  [address]  [place], [place] [address]  Phone [phone]  [facility] [place], [place], exemplifies the [employer] vision to improve the health and quality of life of all [place] by Regulatory affairs officer with a passion to care for the underserved and by leading the nation in community-based, service learning oral health education. We are committed to offering comprehensive general dental services for adults, children and special needs patients in a safe, caring and professional setting.  Appointments: Our clinic is open Monday through Friday 8:00 a.m. until 5:00 p.m. The amount of time scheduled for an appointment depends on the patients specific needs. We ask that you keep your appointed time for care or provide 24-hour notice of all appointment changes. Parents or legal guardians must accompany minor children.  Payment for Services: Medicaid and other insurance plans are welcome. Payment for services is due when services are rendered and may be made by cash or credit card. If you have dental insurance, we will assist you with your claim submission.   Emergencies: Emergency services will be provided Monday through Friday on a walk-in basis. Please arrive early for emergency services. After hours emergency services will be provided for patients of record as  required.  Services:  Marine scientist Dentistry  Oral Surgery - Extractions  Root Canals  Sealants and Tooth Colored Fillings  Crowns and Bridges  Dentures and Partial Dentures  Implant Services  Periodontal Services and Cleanings  Cosmetic Statistician  3-D/[facility]

## 2015-11-20 NOTE — ED Notes (Signed)
He c/o bilateral upper dental pain for past several days. The pain is so bad its making his head hurt. He reports being seen here multiple times for dental pain and infections in same teeth, has seen a dentist but they told him he needs to see a specialist and he can not afford it.

## 2015-11-20 NOTE — ED Notes (Signed)
See PA assessment 

## 2015-11-24 ENCOUNTER — Encounter (HOSPITAL_COMMUNITY): Payer: Self-pay | Admitting: Emergency Medicine

## 2015-11-24 DIAGNOSIS — Z792 Long term (current) use of antibiotics: Secondary | ICD-10-CM | POA: Insufficient documentation

## 2015-11-24 DIAGNOSIS — L02214 Cutaneous abscess of groin: Secondary | ICD-10-CM | POA: Insufficient documentation

## 2015-11-24 DIAGNOSIS — L0231 Cutaneous abscess of buttock: Secondary | ICD-10-CM | POA: Insufficient documentation

## 2015-11-24 DIAGNOSIS — F17210 Nicotine dependence, cigarettes, uncomplicated: Secondary | ICD-10-CM | POA: Insufficient documentation

## 2015-11-24 NOTE — ED Notes (Signed)
Pt. reports skin abscess at right groin with pain and drainage onset yesterday , denies fever .

## 2015-11-25 ENCOUNTER — Emergency Department (HOSPITAL_COMMUNITY)
Admission: EM | Admit: 2015-11-25 | Discharge: 2015-11-25 | Disposition: A | Discharge status: Home / Self Care | Payer: Self-pay | Attending: Emergency Medicine | Admitting: Emergency Medicine

## 2015-11-25 DIAGNOSIS — L0231 Cutaneous abscess of buttock: Secondary | ICD-10-CM

## 2015-11-25 MED ORDER — SULFAMETHOXAZOLE-TRIMETHOPRIM 800-160 MG PO TABS
1.0000 | ORAL_TABLET | Freq: Once | ORAL | Status: AC
  Administered 2015-11-25: 1 via ORAL
  Filled 2015-11-25: qty 1

## 2015-11-25 MED ORDER — SULFAMETHOXAZOLE-TRIMETHOPRIM 800-160 MG PO TABS: 1.0000 | ORAL_TABLET | Freq: Two times a day (BID) | ORAL | Status: AC

## 2015-11-25 MED ORDER — LIDOCAINE-EPINEPHRINE (PF) 2 %-1:200000 IJ SOLN
10.0000 mL | Freq: Once | INTRAMUSCULAR | Status: AC
  Administered 2015-11-25: 10 mL via INTRADERMAL
  Filled 2015-11-25: qty 20

## 2015-11-25 MED ORDER — HYDROCODONE-ACETAMINOPHEN 5-325 MG PO TABS: 1.0000 | ORAL_TABLET | Freq: Four times a day (QID) | ORAL | Status: DC | PRN

## 2015-11-25 NOTE — ED Provider Notes (Signed)
By signing my name below, I, Eustaquio Maize, attest that this documentation has been prepared under the direction and in the presence of [employer], DO. Electronically Signed: Eustaquio Maize, ED Scribe. 11/25/2015. 12:49 AM.  TIME SEEN: 12:42 AM  CHIEF COMPLAINT: Abscess  HPI: HPI Comments: Dennis Pope is a 29 y.o. male who presents to the Emergency Department complaining of two abscess to right groin and right inner buttock that began 1 day ago. He complains of increased pain and tenderness to the area. Pt reports drainage to the abscess in the right groin but denies drainage to the abscess on his right buttock. Pt has hx of recurrent abscesses.  Denies fever, chills, nausea, vomiting, diarrhea, or any other associated symptoms. No hx DM. Is not immunocompromised in any way.   ROS: See HPI Constitutional: no fever, chills Eyes: no drainage  ENT: no runny nose   Cardiovascular:  no chest pain  Resp: no SOB  GI: no nausea, vomiting, diarrhea GU: no dysuria Integumentary: abscesses. no rash  Allergy: no hives  Musculoskeletal: no leg swelling  Neurological: no slurred speech ROS otherwise negative  PAST MEDICAL HISTORY/PAST SURGICAL HISTORY:  History reviewed. No pertinent past medical history.  MEDICATIONS:  Prior to Admission medications   Medication Sig Start Date End Date Taking? Authorizing Provider  amoxicillin (AMOXIL) 500 MG capsule Take 1 capsule (500 mg total) by mouth 3 (three) times daily. 01/18/15   Janne Napoleon, NP  clindamycin (CLEOCIN) 150 MG capsule Take 1 capsule (150 mg total) by mouth every 6 (six) hours. 07/09/15   Tiffany Carlota Raspberry, PA-C  ibuprofen (ADVIL,MOTRIN) 600 MG tablet Take 1 tablet (600 mg total) by mouth every 6 (six) hours as needed. 11/20/15   Comer Locket, PA-C  oxyCODONE-acetaminophen (PERCOCET/ROXICET) 5-325 MG per tablet Take 1-2 tablets by mouth every 6 (six) hours as needed. 07/09/15   Delos Haring, PA-C  penicillin v potassium (VEETID)  500 MG tablet Take 1 tablet (500 mg total) by mouth 4 (four) times daily. 11/20/15 11/27/15  Comer Locket, PA-C  traMADol (ULTRAM) 50 MG tablet 1-2 tabs po q 6 hr prn pain Maximum dose= 8 tablets per day 01/18/15   Janne Napoleon, NP  traMADol (ULTRAM) 50 MG tablet Take 1 tablet (50 mg total) by mouth every 6 (six) hours as needed. 06/30/15   Tatyana Kirichenko, PA-C    ALLERGIES:  No Known Allergies  SOCIAL HISTORY:  Social History  Substance Use Topics  . Smoking status: Current Every Day Smoker -- 0.20 packs/day    Types: Cigars  . Smokeless tobacco: Not on file  . Alcohol Use: No    FAMILY HISTORY: Family History  Problem Relation Age of Onset  . Diabetes Mother   . Diabetes Father     EXAM: Triage Vitals: BP 132/94 mmHg  Pulse 87  Temp(Src) 99.2 F (37.3 C) (Oral)  Resp 20  SpO2 100%   CONSTITUTIONAL: Alert and oriented and responds appropriately to questions. Well-appearing; well-nourished HEAD: Normocephalic EYES: Conjunctivae clear, PERRL ENT: normal nose; no rhinorrhea; moist mucous membranes; pharynx without lesions noted NECK: Supple, no meningismus, no LAD  CARD: RRR; S1 and S2 appreciated; no murmurs, no clicks, no rubs, no gallops RESP: Normal chest excursion without splinting or tachypnea; breath sounds clear and equal bilaterally; no wheezes, no rhonchi, no rales, no hypoxia or respiratory distress, speaking full sentences ABD/GI: Normal bowel sounds; non-distended; soft, non-tender, no rebound, no guarding, no peritoneal signs BACK:  The back appears normal and is non-tender  to palpation, there is no CVA tenderness EXT: Normal ROM in all joints; non-tender to palpation; no edema; normal capillary refill; no cyanosis, no calf tenderness or swelling    SKIN: Normal color for age and race; warm. 3 mm x 1 mm fluctuant area and a 5 mm x 2 mm area in the right inguinal area with no drainage and no surrounding cellulitis. 2 x 3 cm fluctuant area to the inner aspect of  the right buttock with no drainage and no surrounding cellulitis. No perineal crepitus or subcutaneous emphysema.  No other rash or lesions noted. NEURO: Moves all extremities equally, sensation to light touch intact diffusely, cranial nerves II through XII intact PSYCH: The patient's mood and manner are appropriate. Grooming and personal hygiene are appropriate.  MEDICAL DECISION MAKING: Patient here with a drainable abscess to the right buttock. I performed incision and drainage was a moderate amount of purulent drainage. He refused packing. He also has 2 very small abscesses in the inguinal fold of the right thigh. No sign of Fournier's gangrene, crepitus or subcutaneous air. These areas do not appear to be amenable to incision and drainage and at this time he refuses IMD of these areas. We'll discharge patient on Bactrim. He is well-appearing, nontoxic without systemic symptoms. Discussed return precautions. Discussed supportive care instructions including warm soaks, warm compresses. We'll discharge with pain medication as well. He verbalizes understanding and is comfortable with this plan.     INCISION AND DRAINAGE Performed by: Nyra Jabs Consent: Verbal consent obtained. Risks and benefits: risks, benefits and alternatives were discussed Type: abscess  Body area: Right buttock in the gluteal cleft  Anesthesia: local infiltration  Incision was made with a scalpel.  Local anesthetic: lidocaine 2 % with epinephrine  Anesthetic total: 5 ml  Complexity: complex Blunt dissection to break up loculations  Drainage: purulent  Drainage amount: Moderate   Packing material: Patient refused   Patient tolerance: Patient tolerated the procedure well with no immediate complications.     I personally performed the services described in this documentation, which was scribed in my presence. The recorded information has been reviewed and is accurate.    [place],  DO 11/25/15 [phone]

## 2015-11-25 NOTE — Discharge Instructions (Signed)

## 2015-11-26 IMAGING — CT CT HEAD W/O CM
1 series · 16 of 30 positions shown, 20 images · non-contrast
Comparison: None.

CLINICAL DATA: 29-year-old male with foot pain and headache, blurry
vision.

EXAM:
CT HEAD WITHOUT CONTRAST
TECHNIQUE: Contiguous axial images were obtained from the base of the skull
through the vertex without intravenous contrast.

[Series 2: head 5.0 h30s · axial · 0.42mm/px · z∈[-171,-36]mm · 16 of 30 slices shown, 20 images]
[im 2/30  brain]
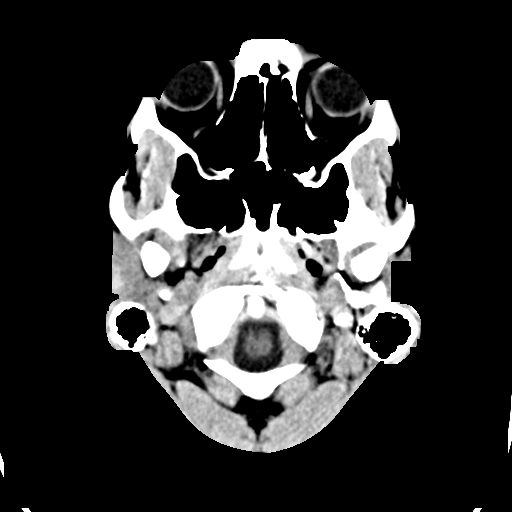
[im 2/30  bone]
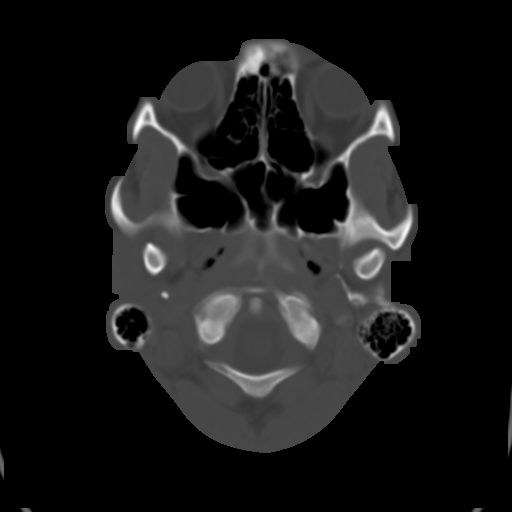
[im 4/30  brain]
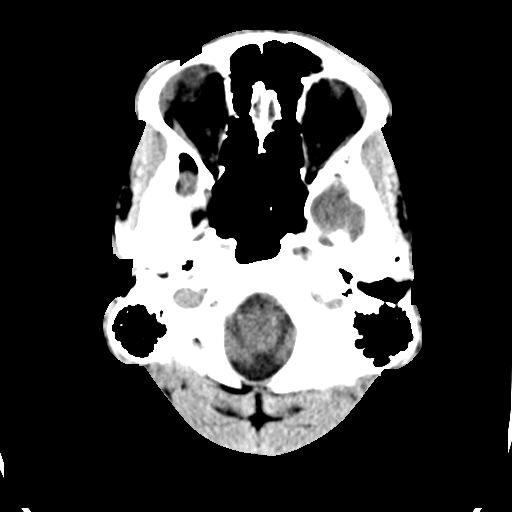
[im 6/30  brain]
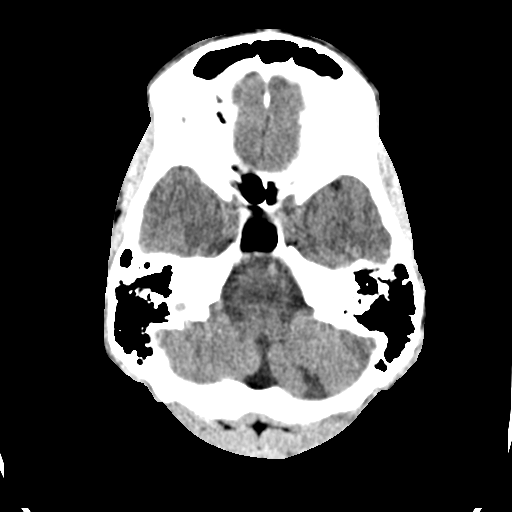
[im 8/30  brain]
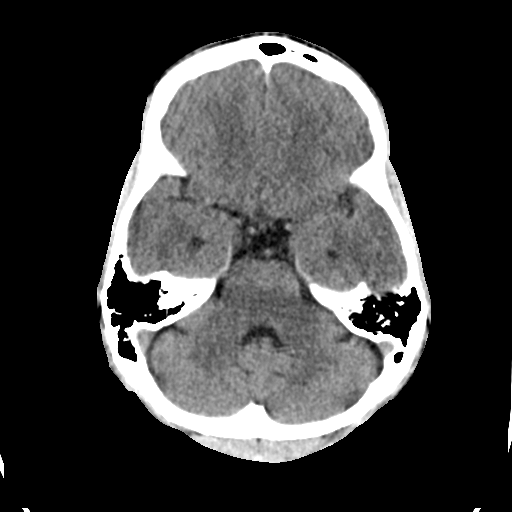
[im 9/30  brain]
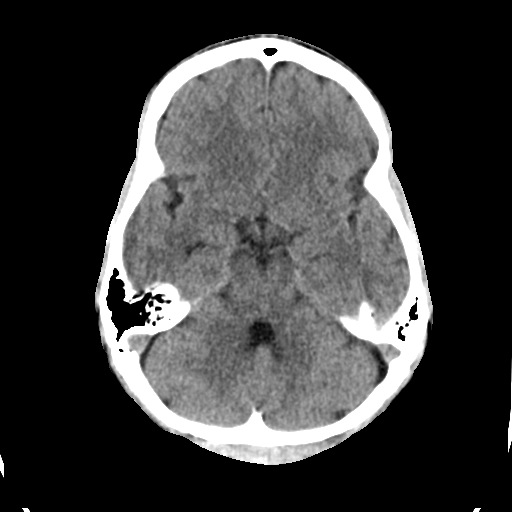
[im 9/30  bone]
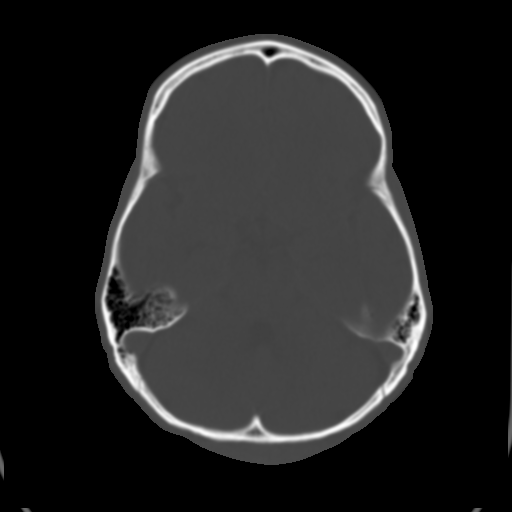
[im 11/30  brain]
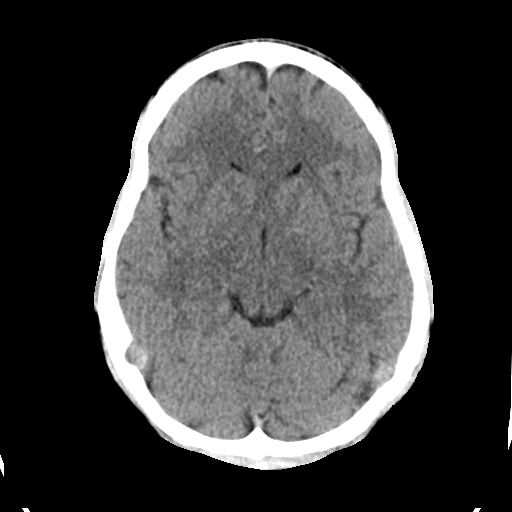
[im 13/30  brain]
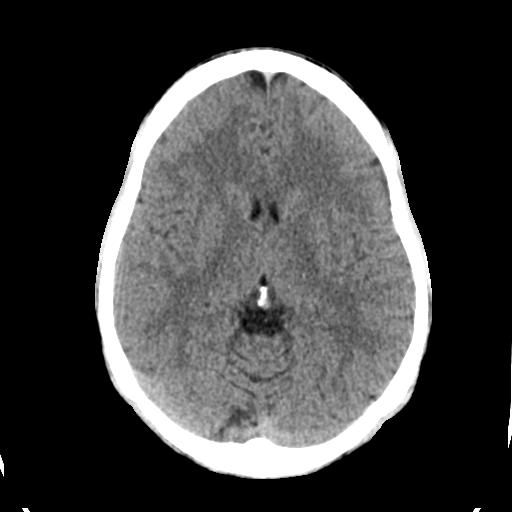
[im 15/30  brain]
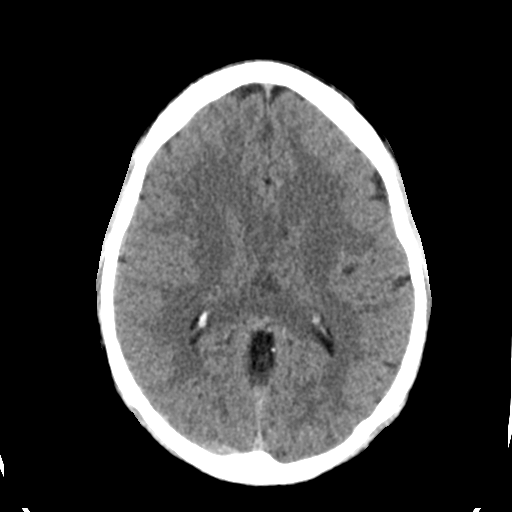
[im 16/30  brain]
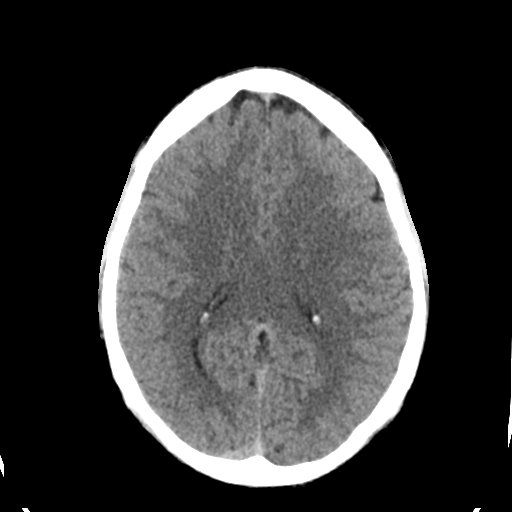
[im 16/30  bone]
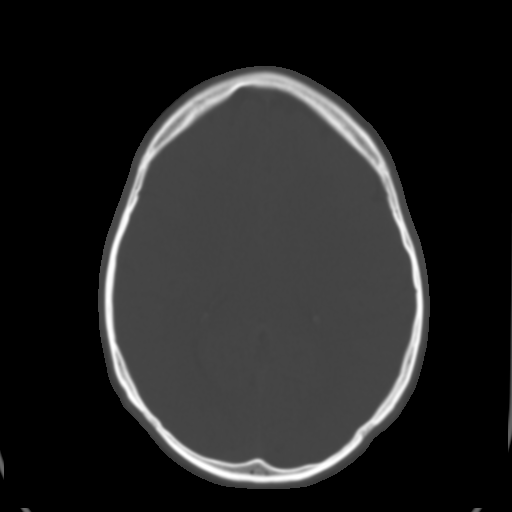
[im 18/30  brain]
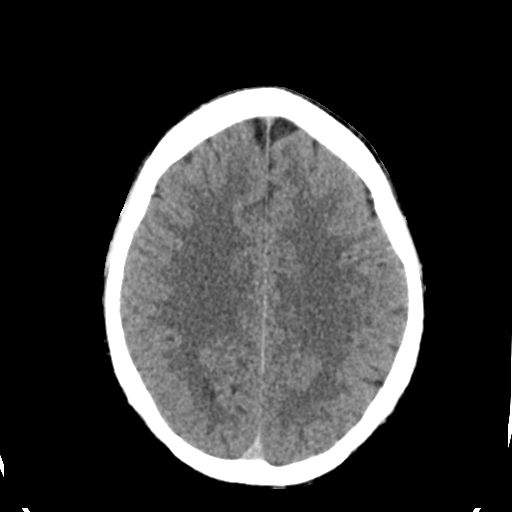
[im 20/30  brain]
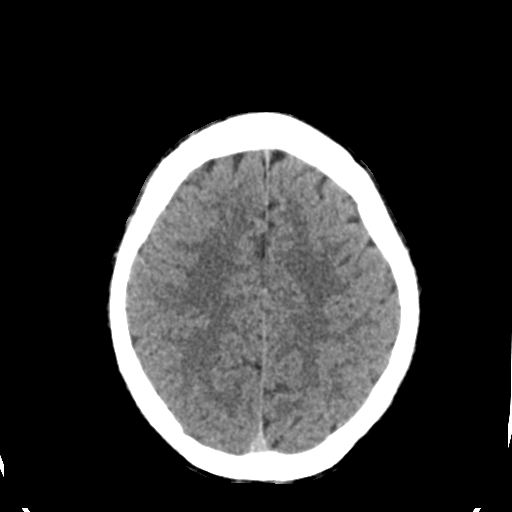
[im 22/30  brain]
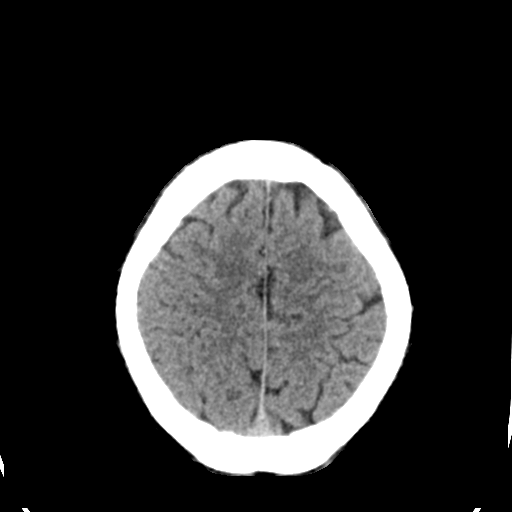
[im 23/30  brain]
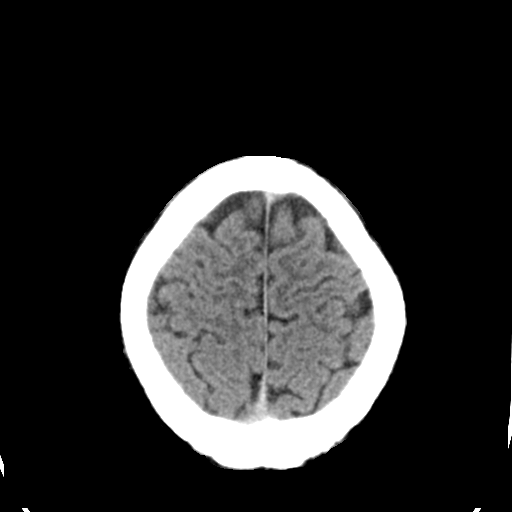
[im 23/30  bone]
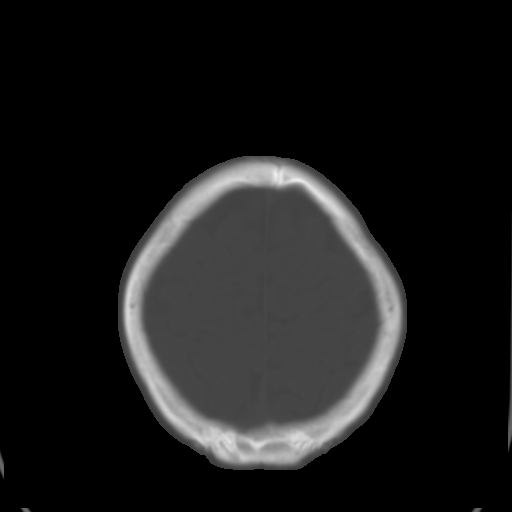
[im 25/30  brain]
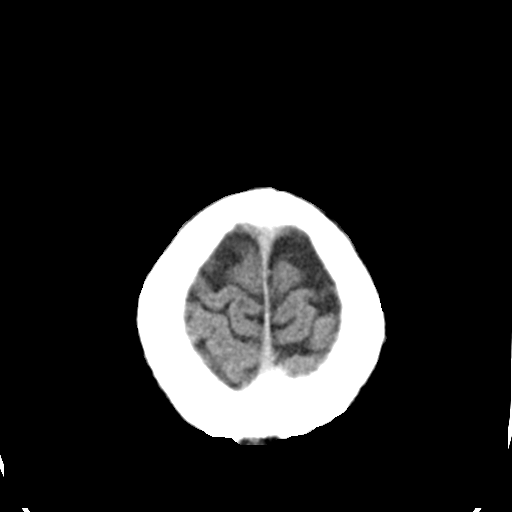
[im 27/30  brain]
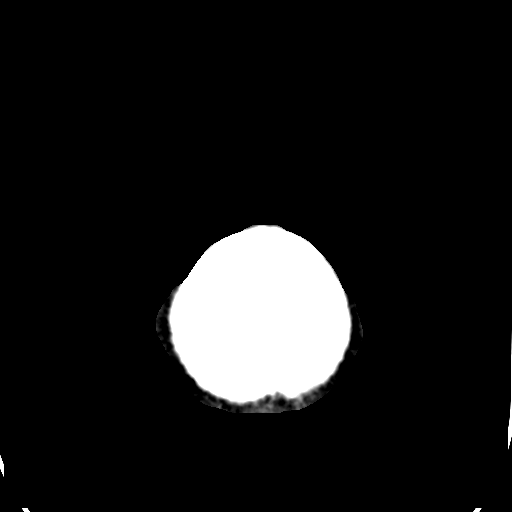
[im 29/30  brain]
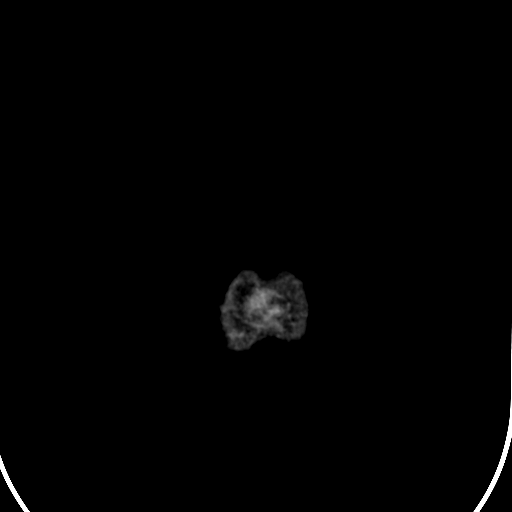

[16 of 30 positions shown; findings below may reference images not displayed]

FINDINGS: The ventricles and the sulci are appropriate in size for the
patient's age. There is no intracranial hemorrhage. No midline shift
or mass effect identified. The gray-white matter differentiation is
preserved. A 1.4 x 0.5 cm triangular hypodense area is noted in the
left cerebellum likely related to an old insult with
encephalomalacia. Clinical correlation is recommended. MRI may
provide better evaluation if clinically indicated.

The visualized paranasal sinuses and mastoid air cells are well
aerated. The calvarium is intact.
IMPRESSION: No acute intracranial pathology.

Small focal left cerebellar hypodensity, likely encephalomalacia
related to an old insult. Clinical correlation is recommended.

## 2015-12-20 ENCOUNTER — Emergency Department (HOSPITAL_COMMUNITY)
Admission: EM | Admit: 2015-12-20 | Discharge: 2015-12-20 | Disposition: A | Discharge status: Home / Self Care | Payer: Self-pay | Attending: Emergency Medicine | Admitting: Emergency Medicine

## 2015-12-20 ENCOUNTER — Encounter (HOSPITAL_COMMUNITY): Payer: Self-pay | Admitting: *Deleted

## 2015-12-20 DIAGNOSIS — F17210 Nicotine dependence, cigarettes, uncomplicated: Secondary | ICD-10-CM | POA: Insufficient documentation

## 2015-12-20 DIAGNOSIS — L02214 Cutaneous abscess of groin: Secondary | ICD-10-CM | POA: Insufficient documentation

## 2015-12-20 MED ORDER — LIDOCAINE HCL 2 % IJ SOLN
20.0000 mL | Freq: Once | INTRAMUSCULAR | Status: DC
  Filled 2015-12-20: qty 20

## 2015-12-20 MED ORDER — IBUPROFEN 800 MG PO TABS: 800.0000 mg | ORAL_TABLET | Freq: Three times a day (TID) | ORAL | Status: DC | PRN

## 2015-12-20 NOTE — ED Notes (Signed)
C/o abscess in groin area states he had one 3 weeks ago

## 2015-12-20 NOTE — ED Provider Notes (Signed)
CSN: [id number]     Arrival date & time 12/20/15  [id number] History   First MD Initiated Contact with Patient 12/20/15 1108     Chief Complaint  Patient presents with  . Cyst     (Consider location/radiation/quality/duration/timing/severity/associated sxs/prior Treatment) HPI Patient presents to the emergency department with an abscess to his left groin region.  The patient states that he developed this over the last few days.  The patient states that he has had these previously.  Patient denies fever, nausea, vomiting, weakness, dizziness, dysuria, incontinence, or syncope.  The patient states that he did not try any treatment at home. History reviewed. No pertinent past medical history. History reviewed. No pertinent past surgical history. Family History  Problem Relation Age of Onset  . Diabetes Mother   . Diabetes Father    Social History  Substance Use Topics  . Smoking status: Current Every Day Smoker -- 0.20 packs/day    Types: Cigars  . Smokeless tobacco: None  . Alcohol Use: No    Review of Systems  All other systems negative except as documented in the HPI. All pertinent positives and negatives as reviewed in the HPI.  Allergies  Review of patient's allergies indicates no known allergies.  Home Medications   Prior to Admission medications   Medication Sig Start Date End Date Taking? Authorizing Provider  HYDROcodone-acetaminophen (NORCO/VICODIN) 5-325 MG tablet Take 1-2 tablets by mouth every 6 (six) hours as needed. 11/25/15  Yes Kristen N Ward, DO  ibuprofen (ADVIL,MOTRIN) 600 MG tablet Take 1 tablet (600 mg total) by mouth every 6 (six) hours as needed. Patient taking differently: Take 600 mg by mouth every 6 (six) hours as needed for moderate pain.  11/20/15  Yes Benjamin Cartner, PA-C   BP 123/86 mmHg  Pulse 68  Temp(Src) 98 F (36.7 C) (Oral)  Resp 16  Ht 5\' 10"  (1.778 m)  Wt 72.576 kg  BMI 22.96 kg/m2  SpO2 100% Physical Exam  Constitutional: He is  oriented to person, place, and time. He appears well-developed and well-nourished. No distress.  HENT:  Head: Normocephalic and atraumatic.  Neurological: He is alert and oriented to person, place, and time. He exhibits normal muscle tone. Coordination normal.  Skin:     Nursing note and vitals reviewed.   ED Course  Procedures (including critical care time) Labs Review Labs Reviewed - No data to display  Imaging Review No results found. I have personally reviewed and evaluated these images and lab results as part of my medical decision-making.  Patient will be discharged home, told to return here as needed.  I have advised him to use warm soaks and warm compresses, keep the area clean and dry.  He refused any packing material    INCISION AND DRAINAGE Performed by: Brent General Consent: Verbal consent obtained. Risks and benefits: risks, benefits and alternatives were discussed Type: abscess  Body area: Left groin lateral to the scrotum, but does not involve the scrotal area  Anesthesia: local infiltration  Incision was made with a scalpel.  Local anesthetic: lidocaine 2 % without epinephrine  Anesthetic total: 3 ml  Complexity: complex Blunt dissection to break up loculations  Drainage: purulent  Drainage amount: Large   Packing material: No packing material per patient request   Patient tolerance: Patient tolerated the procedure well with no immediate complications.     Dalia Heading, PA-C 12/20/15 1220  Merrily Pew, MD 12/20/15 [phone]

## 2015-12-20 NOTE — Discharge Instructions (Signed)
Use warm soaks and warm compresses, keep the area clean and dry, keep it covered as well.  Return here as needed

## 2016-06-14 ENCOUNTER — Encounter (HOSPITAL_COMMUNITY): Payer: Self-pay | Admitting: *Deleted

## 2017-01-14 ENCOUNTER — Emergency Department (HOSPITAL_COMMUNITY)
Admission: EM | Admit: 2017-01-14 | Discharge: 2017-01-14 | Disposition: A | Discharge status: Home / Self Care | Payer: Self-pay | Attending: Emergency Medicine | Admitting: Emergency Medicine

## 2017-01-14 ENCOUNTER — Encounter (HOSPITAL_COMMUNITY): Payer: Self-pay

## 2017-01-14 DIAGNOSIS — G43009 Migraine without aura, not intractable, without status migrainosus: Secondary | ICD-10-CM

## 2017-01-14 DIAGNOSIS — G43909 Migraine, unspecified, not intractable, without status migrainosus: Secondary | ICD-10-CM | POA: Insufficient documentation

## 2017-01-14 DIAGNOSIS — Z79899 Other long term (current) drug therapy: Secondary | ICD-10-CM | POA: Insufficient documentation

## 2017-01-14 DIAGNOSIS — F17290 Nicotine dependence, other tobacco product, uncomplicated: Secondary | ICD-10-CM | POA: Insufficient documentation

## 2017-01-14 MED ORDER — NAPROXEN 500 MG PO TABS: 500.0000 mg | ORAL_TABLET | Freq: Two times a day (BID) | ORAL | 0 refills | Status: DC

## 2017-01-14 MED ORDER — DIPHENHYDRAMINE HCL 25 MG PO CAPS: 25.0000 mg | ORAL_CAPSULE | Freq: Four times a day (QID) | ORAL | 0 refills | Status: DC | PRN

## 2017-01-14 MED ORDER — VALPROATE SODIUM 500 MG/5ML IV SOLN
500.0000 mg | Freq: Once | INTRAVENOUS | Status: DC
  Filled 2017-01-14: qty 5

## 2017-01-14 MED ORDER — SODIUM CHLORIDE 0.9 % IV BOLUS (SEPSIS)
1000.0000 mL | Freq: Once | INTRAVENOUS | Status: AC
  Administered 2017-01-14: 1000 mL via INTRAVENOUS

## 2017-01-14 MED ORDER — METOCLOPRAMIDE HCL 5 MG/ML IJ SOLN
10.0000 mg | Freq: Once | INTRAMUSCULAR | Status: AC
  Administered 2017-01-14: 10 mg via INTRAVENOUS
  Filled 2017-01-14: qty 2

## 2017-01-14 MED ORDER — DIPHENHYDRAMINE HCL 50 MG/ML IJ SOLN
25.0000 mg | Freq: Once | INTRAMUSCULAR | Status: AC
  Administered 2017-01-14: 25 mg via INTRAVENOUS
  Filled 2017-01-14: qty 1

## 2017-01-14 MED ORDER — METOCLOPRAMIDE HCL 10 MG PO TABS: 10.0000 mg | ORAL_TABLET | Freq: Four times a day (QID) | ORAL | 0 refills | Status: DC | PRN

## 2017-01-14 MED ORDER — KETOROLAC TROMETHAMINE 30 MG/ML IJ SOLN
30.0000 mg | Freq: Once | INTRAMUSCULAR | Status: AC
  Administered 2017-01-14: 30 mg via INTRAVENOUS
  Filled 2017-01-14: qty 1

## 2017-01-14 NOTE — ED Provider Notes (Signed)
[facility] DEPT Provider Note   CSN: [id number] Arrival date & time: 01/14/17  1552  By signing my name below, I, [name], attest that this documentation has been prepared under the direction and in the presence of Tanna Furry, MD . Electronically Signed: [name], Scribe. 01/14/2017. 7:07 PM.  History   Chief Complaint Chief Complaint  Patient presents with  . Headache    The history is provided by the patient. No language interpreter was used.     HPI Comments:  Dennis Pope is a 31 y.o. male who presents to the Emergency Department complaining of a throbbing HA with pain to the bilateral temporal region which began this AM ~1000. He states he was laying in bed when the pain began on the right side. He reports an episode of associated lightheadedness that has resolved. He denies nausea, vomiting, sinus pressure, and photophobia. He has taken ibuprofen without relief. He reports h/o similar milder HA which occurred 2 months after having a dental extraction.   History reviewed. No pertinent past medical history.  There are no active problems to display for this patient.   History reviewed. No pertinent surgical history.     Home Medications    Prior to Admission medications   Medication Sig Start Date End Date Taking? Authorizing Provider  diphenhydrAMINE (BENADRYL) 25 mg capsule Take 1 capsule (25 mg total) by mouth every 6 (six) hours as needed. 01/14/17   Tanna Furry, MD  HYDROcodone-acetaminophen (NORCO/VICODIN) 5-325 MG tablet Take 1-2 tablets by mouth every 6 (six) hours as needed. 11/25/15   Kristen N Ward, DO  ibuprofen (ADVIL,MOTRIN) 800 MG tablet Take 1 tablet (800 mg total) by mouth every 8 (eight) hours as needed. 12/20/15   Dalia Heading, PA-C  metoCLOPramide (REGLAN) 10 MG tablet Take 1 tablet (10 mg total) by mouth every 6 (six) hours as needed for nausea (Headache. take with benadryl and Naprosyn). 01/14/17   Tanna Furry, MD  naproxen (NAPROSYN)  500 MG tablet Take 1 tablet (500 mg total) by mouth 2 (two) times daily. 01/14/17   Tanna Furry, MD  penicillin v potassium (VEETID) 500 MG tablet Take 1 tablet (500 mg total) by mouth 3 (three) times daily. 04/16/15   Charlann Lange, PA-C  traMADol (ULTRAM) 50 MG tablet Take 1 tablet (50 mg total) by mouth every 6 (six) hours as needed. 04/16/15   Charlann Lange, PA-C    Family History Family History  Problem Relation Age of Onset  . Diabetes Mother   . Diabetes Father     Social History Social History  Substance Use Topics  . Smoking status: Current Every Day Smoker    Packs/day: 0.20    Types: Cigars  . Smokeless tobacco: Never Used  . Alcohol use No     Allergies   Patient has no known allergies.   Review of Systems Review of Systems  Constitutional: Negative for appetite change, chills, diaphoresis, fatigue and fever.  HENT: Negative for mouth sores, sore throat and trouble swallowing.   Eyes: Negative for photophobia and visual disturbance.  Respiratory: Negative for cough, chest tightness, shortness of breath and wheezing.   Cardiovascular: Negative for chest pain.  Gastrointestinal: Negative for abdominal distention, abdominal pain, diarrhea, nausea and vomiting.  Endocrine: Negative for polydipsia, polyphagia and polyuria.  Genitourinary: Negative for dysuria, frequency and hematuria.  Musculoskeletal: Negative for gait problem.  Skin: Negative for color change, pallor and rash.  Neurological: Positive for light-headedness and headaches. Negative for dizziness and syncope.  Hematological: Does not bruise/bleed easily.  Psychiatric/Behavioral: Negative for behavioral problems and confusion.     Physical Exam Updated Vital Signs BP 134/73 (BP Location: Right Arm)   Pulse 77   Temp 99.1 F (37.3 C) (Oral)   Resp 20   SpO2 99%   Physical Exam  Constitutional: He is oriented to person, place, and time. He appears well-developed and well-nourished. No distress.    HENT:  Head: Normocephalic.  Eyes: Conjunctivae are normal. Pupils are equal, round, and reactive to light. No scleral icterus.  Neck: Normal range of motion. Neck supple. No thyromegaly present.  Cardiovascular: Normal rate and regular rhythm.  Exam reveals no gallop and no friction rub.   No murmur heard. Pulmonary/Chest: Effort normal and breath sounds normal. No respiratory distress. He has no wheezes. He has no rales.  Abdominal: Soft. Bowel sounds are normal. He exhibits no distension. There is no tenderness. There is no rebound.  Musculoskeletal: Normal range of motion.  Neurological: He is alert and oriented to person, place, and time.  Symmetric, intact cranial nerves. Awake and alert. Normal symmetric peripheral neurological exam.  Skin: Skin is warm and dry. No rash noted.  Psychiatric: He has a normal mood and affect. His behavior is normal.  Nursing note and vitals reviewed.    ED Treatments / Results  DIAGNOSTIC STUDIES:  Oxygen Saturation is 98% on RA, normal by my interpretation.    COORDINATION OF CARE:  7:03 PM Discussed treatment plan with pt at bedside and pt agreed to plan.  Labs (all labs ordered are listed, but only abnormal results are displayed) Labs Reviewed - No data to display  EKG  EKG Interpretation None       Radiology No results found.  Procedures Procedures (including critical care time)  Medications Ordered in ED Medications  valproate (DEPACON) 500 mg in dextrose 5 % 50 mL IVPB (not administered)  ketorolac (TORADOL) 30 MG/ML injection 30 mg (30 mg Intravenous Given 01/14/17 1959)  diphenhydrAMINE (BENADRYL) injection 25 mg (25 mg Intravenous Given 01/14/17 1959)  metoCLOPramide (REGLAN) injection 10 mg (10 mg Intravenous Given 01/14/17 1959)  sodium chloride 0.9 % bolus 1,000 mL (1,000 mLs Intravenous New Bag/Given 01/14/17 1959)     Initial Impression / Assessment and Plan / ED Course  I have reviewed the triage vital signs and  the nursing notes.  Pertinent labs & imaging results that were available during my care of the patient were reviewed by me and considered in my medical decision making (see chart for details).     Patient given Toradol, Reglan, Benadryl, IV fluid 1 L and his headache is down to a 2. Symptoms reminiscent of and responded to treatment as though this is likely first time migraine headache. Plan will be discharge home per scheduled for Benadryl, Reglan, naproxen for any recurrence.  Final Clinical Impressions(s) / ED Diagnoses   Final diagnoses:  Migraine without aura and without status migrainosus, not intractable    New Prescriptions New Prescriptions   DIPHENHYDRAMINE (BENADRYL) 25 MG CAPSULE    Take 1 capsule (25 mg total) by mouth every 6 (six) hours as needed.   METOCLOPRAMIDE (REGLAN) 10 MG TABLET    Take 1 tablet (10 mg total) by mouth every 6 (six) hours as needed for nausea (Headache. take with benadryl and Naprosyn).   NAPROXEN (NAPROSYN) 500 MG TABLET    Take 1 tablet (500 mg total) by mouth 2 (two) times daily.   I personally performed the services described  in this documentation, which was scribed in my presence. The recorded information has been reviewed and is accurate.     Tanna Furry, MD 01/14/17 2051

## 2017-01-14 NOTE — ED Triage Notes (Signed)
Pt reports he woke up this morning with headache. He has hx of similar headache with bad teeth. Pt reports he took 600mg  of ibuprofen PTA without improvement.

## 2017-01-14 NOTE — ED Notes (Signed)
Pt reports 600 mg ibuprofen at home for headache.

## 2017-01-14 NOTE — Discharge Instructions (Signed)
General, Reglan, naproxen as needed for migraine headache.

## 2017-02-22 ENCOUNTER — Encounter (HOSPITAL_COMMUNITY): Payer: Self-pay | Admitting: *Deleted

## 2017-02-22 ENCOUNTER — Ambulatory Visit (HOSPITAL_COMMUNITY)
Admission: EM | Admit: 2017-02-22 | Discharge: 2017-02-22 | Disposition: A | Discharge status: Home / Self Care | Payer: Self-pay | Attending: Family Medicine | Admitting: Family Medicine

## 2017-02-22 DIAGNOSIS — K0889 Other specified disorders of teeth and supporting structures: Secondary | ICD-10-CM

## 2017-02-22 MED ORDER — PENICILLIN V POTASSIUM 500 MG PO TABS
500.0000 mg | ORAL_TABLET | Freq: Three times a day (TID) | ORAL | 0 refills | Status: AC
Start: 2017-02-22 — End: 2017-03-01

## 2017-02-22 MED ORDER — IBUPROFEN 800 MG PO TABS: 800.0000 mg | ORAL_TABLET | Freq: Three times a day (TID) | ORAL | 0 refills | Status: DC

## 2017-02-22 NOTE — ED Triage Notes (Signed)
Pt  reports  A  Toothache  l  Upper  Side  For  4  Days      Took  A  Pain pill  That  He  Had  At  Home       Earlier  Today

## 2017-02-22 NOTE — Discharge Instructions (Signed)
I'm treating you tonight for dental pain. Have prescribed penicillin, take one tablet 3 times a day for 7 days. For pain I prescribed 800 mg of ibuprofen, take one tablet every 8 hours as needed for pain. I provided a handout for a low-cost dental provider who can offer care for your tooth. I recommend you follow-up with Dr. Veva Holes for further evaluation and management of your pain.

## 2017-02-22 NOTE — ED Provider Notes (Cosign Needed)
CSN: [id number]     Arrival date & time 02/22/17  1927 History   None    Chief Complaint  Patient presents with  . Dental Pain   (Consider location/radiation/quality/duration/timing/severity/associated sxs/prior Treatment) 31 year old male presents with a chief complaint of dental pain for previous 4 days, states his sleep upper tooth on the left side of his face. He states he has difficulty chewing, describes his pain as constant, throbbing, and intense. Denies fever, swelling, redness, nausea, or other symptoms. He has not seen a dentist in a significant period of time, and does have prior history of dental extraction.   The history is provided by the patient.  Dental Pain    History reviewed. No pertinent past medical history. No past surgical history on file. Family History  Problem Relation Age of Onset  . Diabetes Mother   . Diabetes Father    Social History  Substance Use Topics  . Smoking status: Current Every Day Smoker    Packs/day: 0.20    Types: Cigars  . Smokeless tobacco: Never Used  . Alcohol use No    Review of Systems  Reason unable to perform ROS: As covered in history of present illness.  All other systems reviewed and are negative.   Allergies  Patient has no known allergies.  Home Medications   Prior to Admission medications   Medication Sig Start Date End Date Taking? Authorizing Provider  diphenhydrAMINE (BENADRYL) 25 mg capsule Take 1 capsule (25 mg total) by mouth every 6 (six) hours as needed. 01/14/17   Tanna Furry, MD  ibuprofen (ADVIL,MOTRIN) 800 MG tablet Take 1 tablet (800 mg total) by mouth 3 (three) times daily. 02/22/17   Barnet Glasgow, NP  penicillin v potassium (VEETID) 500 MG tablet Take 1 tablet (500 mg total) by mouth 3 (three) times daily. 02/22/17 03/01/17  Barnet Glasgow, NP   Meds Ordered and Administered this Visit  Medications - No data to display  BP 140/88 (BP Location: Right Arm)   Pulse 78   Temp 98.6 F (37 C) (Oral)    Resp 18   SpO2 99%  No data found.   Physical Exam  Constitutional: He is oriented to person, place, and time. He appears well-developed and well-nourished. No distress.  HENT:  Head: Normocephalic and atraumatic.  Right Ear: External ear normal.  Left Ear: External ear normal.  Mouth/Throat: Uvula is midline, oropharynx is clear and moist and mucous membranes are normal. Tonsils are 0 on the right. Tonsils are 0 on the left. No tonsillar exudate.  Pain at the 14th tooth, no visible signs or symptoms of abscess, or dental carry. 15th and 16th tooth extracted.  Eyes: EOM are normal. Pupils are equal, round, and reactive to light.  Neck: Normal range of motion. Neck supple. No JVD present.  Lymphadenopathy:       Head (right side): No submental, no submandibular, no tonsillar and no preauricular adenopathy present.       Head (left side): No submental, no submandibular, no tonsillar and no preauricular adenopathy present.    He has no cervical adenopathy.  Neurological: He is alert and oriented to person, place, and time.  Skin: Skin is warm and dry. Capillary refill takes less than 2 seconds. He is not diaphoretic.  Psychiatric: He has a normal mood and affect.  Nursing note and vitals reviewed.   Urgent Care Course     Procedures (including critical care time)  Labs Review Labs Reviewed - No data to display  Imaging Review No results found.     MDM   1. Pain, dental    I'm treating you tonight for dental pain. Have prescribed penicillin, take one tablet 3 times a day for 7 days. For pain I prescribed 800 mg of ibuprofen, take one tablet every 8 hours as needed for pain. I provided a handout for a low-cost dental provider who can offer care for your tooth. I recommend you follow-up with Dr. Veva Holes for further evaluation and management of your pain.      Barnet Glasgow, NP 02/22/17 2045

## 2017-10-23 ENCOUNTER — Emergency Department (HOSPITAL_COMMUNITY)
Admission: EM | Admit: 2017-10-23 | Discharge: 2017-10-24 | Disposition: A | Discharge status: Home / Self Care | Payer: Self-pay | Attending: Emergency Medicine | Admitting: Emergency Medicine

## 2017-10-23 ENCOUNTER — Encounter (HOSPITAL_COMMUNITY): Payer: Self-pay | Admitting: Emergency Medicine

## 2017-10-23 DIAGNOSIS — L02214 Cutaneous abscess of groin: Secondary | ICD-10-CM | POA: Insufficient documentation

## 2017-10-23 DIAGNOSIS — F17290 Nicotine dependence, other tobacco product, uncomplicated: Secondary | ICD-10-CM | POA: Insufficient documentation

## 2017-10-23 DIAGNOSIS — Z79899 Other long term (current) drug therapy: Secondary | ICD-10-CM | POA: Insufficient documentation

## 2017-10-23 MED ORDER — LIDOCAINE-EPINEPHRINE (PF) 2 %-1:200000 IJ SOLN
10.0000 mL | Freq: Once | INTRAMUSCULAR | Status: AC
  Administered 2017-10-23: 10 mL via INTRADERMAL
  Filled 2017-10-23: qty 20

## 2017-10-23 NOTE — ED Notes (Signed)
EDP draining patient's abscess with this RN as chaperone.

## 2017-10-23 NOTE — ED Provider Notes (Signed)
[facility] EMERGENCY DEPARTMENT Provider Note   CSN: [id number] Arrival date & time: 10/23/17  2214     History   Chief Complaint Chief Complaint  Patient presents with  . Cyst    HPI [facility] is a 31 y.o. male who presents today with chief complaint acute onset, progressively worsening area of tenderness and swelling to the right groin for 2 days.  He endorses constant sharp pain, worsens with palpation and movement.  States that he has had abscesses similar to this in the past which have required draining.  He has not tried anything for his symptoms at this time.  Pain does not radiate.  He denies fevers, chills, testicular pain, pain or swelling to the penis, abnormal drainage, or urinary symptoms.   The history is provided by the patient.    History reviewed. No pertinent past medical history.  There are no active problems to display for this patient.   History reviewed. No pertinent surgical history.     Home Medications    Prior to Admission medications   Medication Sig Start Date End Date Taking? Authorizing Provider  diphenhydrAMINE (BENADRYL) 25 mg capsule Take 1 capsule (25 mg total) by mouth every 6 (six) hours as needed. 01/14/17   Tanna Furry, MD  ibuprofen (ADVIL,MOTRIN) 800 MG tablet Take 1 tablet (800 mg total) by mouth 3 (three) times daily. 02/22/17   Barnet Glasgow, NP    Family History Family History  Problem Relation Age of Onset  . Diabetes Mother   . Diabetes Father     Social History Social History  Substance Use Topics  . Smoking status: Current Every Day Smoker    Packs/day: 0.20    Types: Cigars  . Smokeless tobacco: Never Used  . Alcohol use No     Allergies   Patient has no known allergies.   Review of Systems Review of Systems  Constitutional: Negative for chills and fever.  Genitourinary: Negative for discharge, dysuria, hematuria, penile pain, penile swelling, scrotal swelling, testicular pain  and urgency.  Skin: Positive for wound.     Physical Exam Updated Vital Signs BP 135/83 (BP Location: Right Arm)   Pulse 81   Temp 98.9 F (37.2 C) (Oral)   Resp 18   Ht 5\' 10"  (1.778 m)   Wt 68 kg (150 lb)   SpO2 100%   BMI 21.52 kg/m   Physical Exam  Constitutional: He appears well-developed and well-nourished. No distress.  HENT:  Head: Normocephalic and atraumatic.  Eyes: Conjunctivae are normal. Right eye exhibits no discharge. Left eye exhibits no discharge.  Neck: No JVD present. No tracheal deviation present.  Cardiovascular: Normal rate.   Pulmonary/Chest: Effort normal.  Abdominal: He exhibits no distension.  Musculoskeletal: He exhibits no edema.  Neurological: He is alert.  Skin: Skin is warm and dry. No erythema.  Examination performed in the presence of a chaperone.  3 cm area of fluctuance and tenderness along the right groin near the start of the scrotum.  There appears to be a central pustule.   no erythema, drainage, or bleeding.  Psychiatric: He has a normal mood and affect. His behavior is normal.  Nursing note and vitals reviewed.    ED Treatments / Results  Labs (all labs ordered are listed, but only abnormal results are displayed) Labs Reviewed - No data to display  EKG  EKG Interpretation None       Radiology No results found.  Procedures .Marland KitchenIncision and  Drainage Date/Time: 10/24/2017 12:03 AM Performed by: Rodell Perna A Authorized by: Rodell Perna A   Consent:    Consent obtained:  Verbal   Consent given by:  Patient   Risks discussed:  Incomplete drainage, bleeding and pain Location:    Type:  Cyst   Size:  3cm   Location:  Anogenital   Anogenital location:  Scrotal wall Pre-procedure details:    Skin preparation:  Betadine Anesthesia (see MAR for exact dosages):    Anesthesia method:  Local infiltration   Local anesthetic:  Lidocaine 2% WITH epi Procedure type:    Complexity:  Simple Procedure details:    Needle  aspiration: no     Incision types:  Stab incision   Incision depth:  Subcutaneous   Scalpel blade:  11   Wound management:  Probed and deloculated, irrigated with saline and extensive cleaning   Drainage:  Purulent   Drainage amount:  Moderate   Wound treatment:  Wound left open Post-procedure details:    Patient tolerance of procedure:  Tolerated well, no immediate complications   (including critical care time)  Medications Ordered in ED Medications  lidocaine-EPINEPHrine (XYLOCAINE W/EPI) 2 %-1:200000 (PF) injection 10 mL (10 mLs Intradermal Given by Other 10/23/17 2350)     Initial Impression / Assessment and Plan / ED Course  I have reviewed the triage vital signs and the nursing notes.  Pertinent labs & imaging results that were available during my care of the patient were reviewed by me and considered in my medical decision making (see chart for details).     Patient with cyst amenable to incision and drainage. Was able to remove some of the cyst wall during procedure. Afebrile, VSS, nontoxic in appearance. Abscess was not large enough to warrant packing or drain, wound recheck in 2 days.  Encouraged home warm soaks and flushing.  No signs of cellulitis surrounding skin; no antibiotic therapy is indicated.  Discussed indications for return to the ED sooner. Pt verbalized understanding of and agreement with plan and is safe for discharge home at this time.   Final Clinical Impressions(s) / ED Diagnoses   Final diagnoses:  Abscess of groin, right    New Prescriptions Discharge Medication List as of 10/24/2017 12:05 AM       Renita Papa, PA-C 10/24/17 1324    Charlesetta Shanks, MD 10/29/17 1640

## 2017-10-23 NOTE — ED Triage Notes (Signed)
Pt reports cyst to R groin, hx of same. Painful, worse with walking.

## 2017-10-24 NOTE — ED Notes (Signed)
Patient reports hx of abscess in groin and buttock area - has abscess to R inguinal area.

## 2017-10-24 NOTE — ED Notes (Signed)
Patient verbalized understanding of discharge instructions and denies any further needs or questions at this time. VS stable. Patient ambulatory with steady gait.  

## 2017-10-24 NOTE — Discharge Instructions (Signed)
Keep wound clean and dry. Apply warm compresses throughout the day. Alternate 600 mg of ibuprofen and [phone] mg of Tylenol every 3 hours as needed for pain. Do not exceed 4000 mg of Tylenol daily.  Followup with [facility] in 2 days for wound recheck . Return to emergency department for emergent changing or worsening symptoms.

## 2017-12-19 ENCOUNTER — Encounter (HOSPITAL_COMMUNITY): Payer: Self-pay | Admitting: Emergency Medicine

## 2017-12-19 ENCOUNTER — Other Ambulatory Visit: Payer: Self-pay

## 2017-12-19 ENCOUNTER — Emergency Department (HOSPITAL_COMMUNITY)
Admission: EM | Admit: 2017-12-19 | Discharge: 2017-12-19 | Disposition: A | Discharge status: Home / Self Care | Payer: Self-pay | Attending: Emergency Medicine | Admitting: Emergency Medicine

## 2017-12-19 DIAGNOSIS — R1084 Generalized abdominal pain: Secondary | ICD-10-CM | POA: Insufficient documentation

## 2017-12-19 DIAGNOSIS — F17290 Nicotine dependence, other tobacco product, uncomplicated: Secondary | ICD-10-CM | POA: Insufficient documentation

## 2017-12-19 DIAGNOSIS — Z79899 Other long term (current) drug therapy: Secondary | ICD-10-CM | POA: Insufficient documentation

## 2017-12-19 LAB — COMPREHENSIVE METABOLIC PANEL
ALT: 17 U/L (ref 17–63)
AST: 22 U/L (ref 15–41)
Albumin: 4.3 g/dL (ref 3.5–5.0)
Alkaline Phosphatase: 62 U/L (ref 38–126)
Anion gap: 11 (ref 5–15)
BUN: 8 mg/dL (ref 6–20)
CHLORIDE: 101 mmol/L (ref 101–111)
CO2: 25 mmol/L (ref 22–32)
CREATININE: 1.01 mg/dL (ref 0.61–1.24)
Calcium: 9.7 mg/dL (ref 8.9–10.3)
GFR calc Af Amer: >60 mL/min (ref >60)
GFR calc non Af Amer: >60 mL/min (ref >60)
GLUCOSE: 104 mg/dL — AB (ref 65–99)
Potassium: 4.1 mmol/L (ref 3.5–5.1)
SODIUM: 137 mmol/L (ref 135–145)
Total Bilirubin: 0.7 mg/dL (ref 0.3–1.2)
Total Protein: 7.9 g/dL (ref 6.5–8.1)

## 2017-12-19 LAB — CBC WITH DIFFERENTIAL/PLATELET
Basophils Absolute: 0 10*3/uL (ref 0.0–0.1)
Basophils Relative: 0 %
EOS PCT: 1 %
Eosinophils Absolute: 0.2 10*3/uL (ref 0.0–0.7)
HCT: 41.6 % (ref 39.0–52.0)
Hemoglobin: 14.3 g/dL (ref 13.0–17.0)
LYMPHS ABS: 2 10*3/uL (ref 0.7–4.0)
Lymphocytes Relative: 13 %
MCH: 29.8 pg (ref 26.0–34.0)
MCHC: 34.4 g/dL (ref 30.0–36.0)
MCV: 86.7 fL (ref 78.0–100.0)
MONO ABS: 1.4 10*3/uL — AB (ref 0.1–1.0)
Monocytes Relative: 9 %
Neutro Abs: 12 10*3/uL — ABNORMAL HIGH (ref 1.7–7.7)
Neutrophils Relative %: 77 %
PLATELETS: 325 10*3/uL (ref 150–400)
RBC: 4.8 MIL/uL (ref 4.22–5.81)
RDW: 13.6 % (ref 11.5–15.5)
WBC: 15.6 10*3/uL — ABNORMAL HIGH (ref 4.0–10.5)

## 2017-12-19 LAB — URINALYSIS, ROUTINE W REFLEX MICROSCOPIC
Bilirubin Urine: NEGATIVE
GLUCOSE, UA: NEGATIVE mg/dL
Ketones, ur: NEGATIVE mg/dL
Leukocytes, UA: NEGATIVE
Nitrite: NEGATIVE
Protein, ur: NEGATIVE mg/dL
Specific Gravity, Urine: 1.026 (ref 1.005–1.030)
pH: 5 (ref 5.0–8.0)

## 2017-12-19 LAB — LIPASE, BLOOD: Lipase: 16 U/L (ref 11–51)

## 2017-12-19 LAB — URINALYSIS, MICROSCOPIC (REFLEX)

## 2017-12-19 MED ORDER — MORPHINE SULFATE (PF) 4 MG/ML IV SOLN
4.0000 mg | Freq: Once | INTRAVENOUS | Status: AC
Start: 2017-12-19 — End: 2017-12-19
  Administered 2017-12-19: 4 mg via INTRAVENOUS
  Filled 2017-12-19: qty 1

## 2017-12-19 MED ORDER — ONDANSETRON 4 MG PO TBDP: 4.0000 mg | ORAL_TABLET | Freq: Three times a day (TID) | ORAL | 0 refills | Status: DC | PRN

## 2017-12-19 MED ORDER — ONDANSETRON HCL 4 MG/2ML IJ SOLN
4.0000 mg | Freq: Once | INTRAMUSCULAR | Status: AC
  Administered 2017-12-19: 4 mg via INTRAVENOUS
  Filled 2017-12-19: qty 2

## 2017-12-19 NOTE — ED Provider Notes (Signed)
[facility] EMERGENCY DEPARTMENT Provider Note   CSN: [id number] Arrival date & time: 12/19/17  0932  History   Chief Complaint Chief Complaint  Patient presents with  . Abdominal Pain    HPI [facility] is a 31 y.o. male WITH history of daily marijuana use presents for evaluation of gradually worsening, intermittent, crampy, non radiating periumbilical abdominal pain onset 5 PM yesterday. Gradually worsening and now becoming more constant. Associated symptoms include chills, nausea, vomiting, diarrhea, increased belching and indigestion. States he feels like he needs to have a bowel movement but nothing is coming out and it is making the pain worse. Feels the urge to defecate but is unable to, last time he tried to make himself defecate he vomited. Had 2 big cupcakes and two corn dogs before symptoms started, thinks this may have been the cause. Denies sick contacts or recent travel. No fevers, chest pain, shortness of breath, dysuria, hematuria, melena or hematochezia. Last small BM was 8 hours ago and it was loose/diarrhea. Admits to daily marijuana use but has never had abdominal pain, nausea or vomiting from it. Last use was 24 hours ago. No history of abdominal surgeries, IBS, IBD, PUD, heavy EtOH use. No aggravating or alleviating symptoms.  HPI  History reviewed. No pertinent past medical history.  There are no active problems to display for this patient.   History reviewed. No pertinent surgical history.     Home Medications    Prior to Admission medications   Medication Sig Start Date End Date Taking? Authorizing Provider  diphenhydrAMINE (BENADRYL) 25 mg capsule Take 1 capsule (25 mg total) by mouth every 6 (six) hours as needed. Patient not taking: Reported on 12/19/2017 01/14/17   Tanna Furry, MD  ibuprofen (ADVIL,MOTRIN) 800 MG tablet Take 1 tablet (800 mg total) by mouth 3 (three) times daily. Patient not taking: Reported on 12/19/2017 02/22/17    Barnet Glasgow, NP  ondansetron (ZOFRAN ODT) 4 MG disintegrating tablet Take 1 tablet (4 mg total) by mouth every 8 (eight) hours as needed for nausea or vomiting. 12/19/17   Kinnie Feil, PA-C    Family History Family History  Problem Relation Age of Onset  . Diabetes Mother   . Diabetes Father     Social History Social History   Tobacco Use  . Smoking status: Current Every Day Smoker    Packs/day: 0.20    Types: Cigars  . Smokeless tobacco: Never Used  Substance Use Topics  . Alcohol use: No  . Drug use: Yes    Frequency: 7.0 times per week    Types: Marijuana     Allergies   Patient has no known allergies.   Review of Systems Review of Systems  Constitutional: Positive for chills.  Gastrointestinal: Positive for abdominal pain, diarrhea, nausea and vomiting.  All other systems reviewed and are negative.    Physical Exam Updated Vital Signs BP 131/66   Pulse (!) 59   Temp (!) 97.5 F (36.4 C) (Oral)   Resp 18   Ht 5\' 10"  (1.778 m)   Wt 68.9 kg (152 lb)   SpO2 100%   BMI 21.81 kg/m   Physical Exam  Constitutional: He is oriented to person, place, and time. He appears well-developed and well-nourished. No distress.  NAD. Looks uncomfortable.   HENT:  Head: Normocephalic and atraumatic.  Right Ear: External ear normal.  Left Ear: External ear normal.  Nose: Nose normal.  Moist mucous membranes   Eyes: Conjunctivae  and EOM are normal. No scleral icterus.  Neck: Normal range of motion. Neck supple.  Cardiovascular: Normal rate, regular rhythm, normal heart sounds and intact distal pulses.  No murmur heard. Pulmonary/Chest: Effort normal and breath sounds normal. He has no wheezes.  Abdominal: Soft. Normal appearance and bowel sounds are normal. There is tenderness in the epigastric area and periumbilical area.  Mostly periumbilical abdominal tenderness. No G/R/R. Negative Murphy's and McBurney's. No suprapubic or CVAT.   Musculoskeletal:  Normal range of motion. He exhibits no deformity.  Neurological: He is alert and oriented to person, place, and time.  Skin: Skin is warm and dry. Capillary refill takes less than 2 seconds.  Psychiatric: He has a normal mood and affect. His behavior is normal. Judgment and thought content normal.  Nursing note and vitals reviewed.    ED Treatments / Results  Labs (all labs ordered are listed, but only abnormal results are displayed) Labs Reviewed  COMPREHENSIVE METABOLIC PANEL - Abnormal; Notable for the following components:      Result Value   Glucose, Bld 104 (*)    All other components within normal limits  CBC WITH DIFFERENTIAL/PLATELET - Abnormal; Notable for the following components:   WBC 15.6 (*)    Neutro Abs 12.0 (*)    Monocytes Absolute 1.4 (*)    All other components within normal limits  URINALYSIS, ROUTINE W REFLEX MICROSCOPIC - Abnormal; Notable for the following components:   Hgb urine dipstick SMALL (*)    All other components within normal limits  LIPASE, BLOOD    EKG  EKG Interpretation None       Radiology No results found.  Procedures Procedures (including critical care time)  Medications Ordered in ED Medications  ondansetron (ZOFRAN) injection 4 mg (4 mg Intravenous Given 12/19/17 0743)  morphine 4 MG/ML injection 4 mg (4 mg Intravenous Given 12/19/17 0743)     Initial Impression / Assessment and Plan / ED Course  I have reviewed the triage vital signs and the nursing notes.  Pertinent labs & imaging results that were available during my care of the patient were reviewed by me and considered in my medical decision making (see chart for details).  Clinical Course as of Dec 19 837  Thu Dec 19, 2017  4034 Re-evaluated pt. Reports resolution of nausea, vomiting, abdominal pain. No diarrhea in ED. Discussed benign work up with patient. Recommended PO challenge and anticipate d/c if tolerate. He is agreeable and eager to go home.   [CG]      Clinical Course User Index [CG] Kinnie Feil, PA-C   31 year old male with history of daily marijuana use presents with periumbilical abdominal pain, nausea, vomiting, diarrhea that began after eating to large cupcakes and corn dogs. No fevers, melena or hematochezia. His last marijuana use over 24 hours ago. He denies chronic, cyclical abdominal pain, nausea or vomiting in the past.  Exam is reassuring, no evidence of peritonitis or surgical abdomen. He has nonfocal abdominal tenderness mostly periumbilical. No signs of significant dehydration.   Lab work is reassuring. He has mild leukocytosis at 15.6 with normal electrolytes, creatinine, lipase, liver function. Reevaluation of patient was significantly improved. He is now asymptomatic. We'll attempt oral challenge. Anticipate discharge with Zofran and dental hydration. I don't think patient requires emergent CT of his abdomen or pelvis given reassuring lab work, repeat exam and history most consistent with viral etiology.  Final Clinical Impressions(s) / ED Diagnoses   Final diagnoses:  Generalized abdominal pain  ED Discharge Orders        Ordered    ondansetron (ZOFRAN ODT) 4 MG disintegrating tablet  Every 8 hours PRN     12/19/17 0837       Kinnie Feil, PA-C 12/19/17 0349    Orlie Dakin, MD 12/19/17 [phone]

## 2017-12-19 NOTE — ED Triage Notes (Signed)
Pt c/o 10/10 abd pain, states he is not able to vomit, or have a BM since yesterday at 2200. No fever or chills.

## 2017-12-19 NOTE — Discharge Instructions (Signed)
Your lab work was reassuring. Your white blood cell count was slightly elevated which can happen during acute illness.   I think your symptoms are from a virus or from recent food intake.  Read attached information on viral gastroenteritis and food poisoning. Both are treated similarly with nausea medications, gentle oral hydration and mild diet until symptoms improve in 24-48 hours.   Return to fevers, persistent vomiting, worsening abdominal pain, blood in stools.

## 2018-02-04 ENCOUNTER — Other Ambulatory Visit: Payer: Self-pay

## 2018-02-04 DIAGNOSIS — L02416 Cutaneous abscess of left lower limb: Secondary | ICD-10-CM | POA: Insufficient documentation

## 2018-02-04 DIAGNOSIS — F17290 Nicotine dependence, other tobacco product, uncomplicated: Secondary | ICD-10-CM | POA: Insufficient documentation

## 2018-02-05 ENCOUNTER — Other Ambulatory Visit: Payer: Self-pay

## 2018-02-05 ENCOUNTER — Emergency Department (HOSPITAL_COMMUNITY)
Admission: EM | Admit: 2018-02-05 | Discharge: 2018-02-05 | Disposition: A | Discharge status: Home / Self Care | Payer: Self-pay | Attending: Emergency Medicine | Admitting: Emergency Medicine

## 2018-02-05 ENCOUNTER — Encounter (HOSPITAL_COMMUNITY): Payer: Self-pay | Admitting: Emergency Medicine

## 2018-02-05 DIAGNOSIS — L02416 Cutaneous abscess of left lower limb: Secondary | ICD-10-CM

## 2018-02-05 MED ORDER — SULFAMETHOXAZOLE-TRIMETHOPRIM 800-160 MG PO TABS: 1.0000 | ORAL_TABLET | Freq: Two times a day (BID) | ORAL | 0 refills | Status: AC

## 2018-02-05 MED ORDER — HYDROCODONE-ACETAMINOPHEN 5-325 MG PO TABS: 1.0000 | ORAL_TABLET | ORAL | 0 refills | Status: DC | PRN

## 2018-02-05 MED ORDER — LIDOCAINE-EPINEPHRINE (PF) 2 %-1:200000 IJ SOLN
10.0000 mL | Freq: Once | INTRAMUSCULAR | Status: AC
  Administered 2018-02-05: 10 mL
  Filled 2018-02-05: qty 20

## 2018-02-05 NOTE — ED Triage Notes (Signed)
Pt having abscess on his left thigh that he is having 10/10 pain mostly when he walks, no fever or chills.

## 2018-02-05 NOTE — ED Provider Notes (Signed)
[facility] EMERGENCY DEPARTMENT Provider Note   CSN: [id number] Arrival date & time: 02/04/18  2323     History   Chief Complaint Chief Complaint  Patient presents with  . Abscess    HPI [facility] is a 32 y.o. male.  Patient here with recurrent abscess to left upper thigh x 2 days. No fever, nausea. No testicular pain or swelling.    The history is provided by the patient. No language interpreter was used.    History reviewed. No pertinent past medical history.  There are no active problems to display for this patient.   History reviewed. No pertinent surgical history.     Home Medications    Prior to Admission medications   Medication Sig Start Date End Date Taking? Authorizing Provider  diphenhydrAMINE (BENADRYL) 25 mg capsule Take 1 capsule (25 mg total) by mouth every 6 (six) hours as needed. Patient not taking: Reported on 12/19/2017 01/14/17   Tanna Furry, MD  ibuprofen (ADVIL,MOTRIN) 800 MG tablet Take 1 tablet (800 mg total) by mouth 3 (three) times daily. Patient not taking: Reported on 12/19/2017 02/22/17   Barnet Glasgow, NP  ondansetron (ZOFRAN ODT) 4 MG disintegrating tablet Take 1 tablet (4 mg total) by mouth every 8 (eight) hours as needed for nausea or vomiting. Patient not taking: Reported on 02/05/2018 12/19/17   Kinnie Feil, PA-C    Family History Family History  Problem Relation Age of Onset  . Diabetes Mother   . Diabetes Father     Social History Social History   Tobacco Use  . Smoking status: Current Every Day Smoker    Packs/day: 0.20    Types: Cigars  . Smokeless tobacco: Never Used  Substance Use Topics  . Alcohol use: No  . Drug use: Yes    Frequency: 7.0 times per week    Types: Marijuana     Allergies   Patient has no known allergies.   Review of Systems Review of Systems  Constitutional: Negative for fever.  Gastrointestinal: Negative for nausea.  Musculoskeletal: Negative for  myalgias.  Skin:       Abscess to left upper thigh  Neurological: Negative for weakness and light-headedness.     Physical Exam Updated Vital Signs BP 113/86 (BP Location: Right Arm)   Pulse 64   Temp 97.8 F (36.6 C) (Oral)   Resp 18   Ht 5\' 10"  (1.778 m)   Wt 70.3 kg (155 lb)   SpO2 100%   BMI 22.24 kg/m   Physical Exam  Constitutional: He is oriented to person, place, and time. He appears well-developed and well-nourished.  Neck: Normal range of motion.  Pulmonary/Chest: Effort normal.  Musculoskeletal: Normal range of motion.  Neurological: He is alert and oriented to person, place, and time.  Skin: Skin is warm and dry.  Tender, raised, fluctuant lesion proximal anteromedial thigh with subcutaneous pooling of purulent material visualized.  No significant surrounding erythema.   Psychiatric: He has a normal mood and affect.     ED Treatments / Results  Labs (all labs ordered are listed, but only abnormal results are displayed) Labs Reviewed - No data to display  EKG  EKG Interpretation None       Radiology No results found.  Procedures .Marland KitchenIncision and Drainage Date/Time: 02/05/2018 6:26 AM Performed by: Charlann Lange, PA-C Authorized by: Charlann Lange, PA-C   Consent:    Consent obtained:  Verbal   Consent given by:  Patient Location:  Type:  Abscess   Size:  4 cm x 3 cm   Location:  Lower extremity   Lower extremity location:  Leg   Leg location:  L upper leg Pre-procedure details:    Skin preparation:  Betadine Anesthesia (see MAR for exact dosages):    Anesthesia method:  Local infiltration   Local anesthetic:  Lidocaine 2% WITH epi Procedure type:    Complexity:  Simple Procedure details:    Needle aspiration: no     Incision types:  Stab incision   Incision depth:  Subcutaneous   Scalpel blade:  11   Drainage:  Bloody and purulent   Drainage amount:  Copious   Wound treatment:  Wound left open   Packing materials:   None Post-procedure details:    Patient tolerance of procedure:  Tolerated well, no immediate complications Comments:     Wound culture collected.    (including critical care time)  Medications Ordered in ED Medications  lidocaine-EPINEPHrine (XYLOCAINE W/EPI) 2 %-1:200000 (PF) injection 10 mL (10 mLs Infiltration Given 02/05/18 0616)     Initial Impression / Assessment and Plan / ED Course  I have reviewed the triage vital signs and the nursing notes.  Pertinent labs & imaging results that were available during my care of the patient were reviewed by me and considered in my medical decision making (see chart for details).     Patient presents with recurrent groin abscess x 2 days requiring I&D, performed as per above notation.   Culture obtained secondary to recurrence. Will cover with Septra DS, #4 Hydrocodone for pain. Recommended Ibuprofen as well.   Final Clinical Impressions(s) / ED Diagnoses   Final diagnoses:  None   1. Cutaneous abscess  ED Discharge Orders    None       Charlann Lange, PA-C 02/05/18 5956    Veryl Speak, MD 02/05/18 [phone]

## 2018-02-05 NOTE — ED Notes (Signed)
PT states understanding of care given, follow up care, and medication prescribed. PT ambulated from ED to car with a steady gait. 

## 2018-02-05 NOTE — ED Notes (Signed)
Family at bedside. 

## 2018-02-09 LAB — AEROBIC CULTURE  (SUPERFICIAL SPECIMEN)

## 2018-02-09 LAB — AEROBIC CULTURE W GRAM STAIN (SUPERFICIAL SPECIMEN): Gram Stain: NONE SEEN

## 2018-02-10 ENCOUNTER — Telehealth: Payer: Self-pay | Admitting: Emergency Medicine

## 2018-02-10 NOTE — Telephone Encounter (Signed)
Post ED Visit - Positive Culture Follow-up: Successful Patient Follow-Up  Culture assessed and recommendations reviewed by: []  Elenor Quinones, Pharm.D. []  Heide Guile, Pharm.D., BCPS AQ-ID []  Parks Neptune, Pharm.D., BCPS [x]  Alycia Rossetti, Pharm.D., BCPS []  [place], [place].D., BCPS, AAHIVP []  Legrand Como, Pharm.D., BCPS, AAHIVP []  Salome Arnt, PharmD, BCPS []  Dimitri Ped, PharmD, BCPS []  Vincenza Hews, PharmD, BCPS  Positive wound culture  []  Patient discharged without antimicrobial prescription and treatment is now indicated [x]  Organism is resistant to prescribed ED discharge antimicrobial []  Patient with positive blood cultures  Changes discussed with ED provider: Benedetto Goad PA New antibiotic prescription symptom check, if improved, no additional treatment after current rx. If not improving d/c bactrim and start Augmentin 875mg  po bid x 7 days  Attempting to contact patient   Hazle Nordmann 02/10/2018, 2:13 PM

## 2018-02-10 NOTE — Progress Notes (Signed)
ED Antimicrobial Stewardship Positive Culture Follow Up   [facility] is an 32 y.o. male who presented to [facility] on 02/05/2018 with a chief complaint of  Chief Complaint  Patient presents with  . Abscess    Recent Results (from the past 720 hour(s))  Wound or Superficial Culture     Status: None   Collection Time: 02/05/18  6:40 AM  Result Value Ref Range Status   Specimen Description WOUND THIGH  Final   Special Requests NONE  Final   Gram Stain   Final    NO WBC SEEN NO ORGANISMS SEEN Performed at [facility], [address], [place],  [address]    Culture   Final    FEW ACTINOMYCES SPECIES Standardized susceptibility testing for this organism is not available. RARE PROPIONIBACTERIUM ACNES    Report Status 02/09/2018 FINAL  Final    Needs additional follow-up:  Call for symptom check - If improving/resolving - continue Bactrim - no additional treatment needed at this time - If not improving or persistent - D/c Bactrim and change to Augmentin 875 mg bid x 7 days  ED Provider: Benedetto Goad, PA-C  [name] 02/10/2018, 9:11 AM Infectious Diseases Pharmacist Phone# [phone]

## 2018-04-03 ENCOUNTER — Ambulatory Visit (HOSPITAL_COMMUNITY)
Admission: EM | Admit: 2018-04-03 | Discharge: 2018-04-03 | Disposition: A | Discharge status: Home / Self Care | Payer: Self-pay | Attending: Internal Medicine | Admitting: Internal Medicine

## 2018-04-03 ENCOUNTER — Encounter (HOSPITAL_COMMUNITY): Payer: Self-pay | Admitting: Emergency Medicine

## 2018-04-03 ENCOUNTER — Other Ambulatory Visit: Payer: Self-pay

## 2018-04-03 DIAGNOSIS — R51 Headache: Secondary | ICD-10-CM

## 2018-04-03 DIAGNOSIS — R519 Headache, unspecified: Secondary | ICD-10-CM

## 2018-04-03 MED ORDER — KETOROLAC TROMETHAMINE 60 MG/2ML IM SOLN
INTRAMUSCULAR | Status: AC
  Filled 2018-04-03: qty 2

## 2018-04-03 MED ORDER — DEXAMETHASONE SODIUM PHOSPHATE 10 MG/ML IJ SOLN
INTRAMUSCULAR | Status: AC
  Filled 2018-04-03: qty 1

## 2018-04-03 MED ORDER — METOCLOPRAMIDE HCL 5 MG/ML IJ SOLN
INTRAMUSCULAR | Status: AC
Start: 2018-04-03 — End: ?
  Filled 2018-04-03: qty 2

## 2018-04-03 MED ORDER — FLUTICASONE PROPIONATE 50 MCG/ACT NA SUSP: 2.0000 | Freq: Every day | NASAL | 0 refills | Status: DC

## 2018-04-03 MED ORDER — METOCLOPRAMIDE HCL 5 MG/ML IJ SOLN
5.0000 mg | Freq: Once | INTRAMUSCULAR | Status: AC
  Administered 2018-04-03: 5 mg via INTRAMUSCULAR

## 2018-04-03 MED ORDER — KETOROLAC TROMETHAMINE 60 MG/2ML IM SOLN
60.0000 mg | Freq: Once | INTRAMUSCULAR | Status: AC
  Administered 2018-04-03: 60 mg via INTRAMUSCULAR

## 2018-04-03 MED ORDER — DEXAMETHASONE SODIUM PHOSPHATE 10 MG/ML IJ SOLN
10.0000 mg | Freq: Once | INTRAMUSCULAR | Status: AC
  Administered 2018-04-03: 10 mg via INTRAMUSCULAR

## 2018-04-03 MED ORDER — CETIRIZINE HCL 10 MG PO TABS: 10.0000 mg | ORAL_TABLET | Freq: Every day | ORAL | 0 refills | Status: DC

## 2018-04-03 NOTE — ED Triage Notes (Signed)
Woke with headache on Tuesday.  Headache has continued.

## 2018-04-03 NOTE — Discharge Instructions (Signed)
Toradol, Reglan, Decadron injection in office today.  As discussed, symptoms could also be due to seasonal allergies.  Start Flonase and Zyrtec to help with the symptoms. Stay hydrated, your urine should be clear to pale yellow in color.  Monitor for worsening of symptoms, headache with blurry vision, weakness, dizziness, passing out, imbalance, go to the emergency department for further evaluation.

## 2018-04-03 NOTE — ED Provider Notes (Signed)
[facility]    CSN: [id number] Arrival date & time: 04/03/18  1928     History   Chief Complaint Chief Complaint  Patient presents with  . Headache    HPI Dennis Pope is a 32 y.o. male.   32 year old male comes in for 2-day history of headache.  States first started on left temporal region, and can switch to right temporal region.  Denies photophobia, phonophobia, nausea, vomiting.  Pain described as tightness, worse with head leaning forward.  Denies URI symptoms such as cough, congestion, sore throat.  Denies fever, chills, night sweats.  Denies weakness, dizziness, syncope.  Took Norco, ibuprofen without relief.     History reviewed. No pertinent past medical history.  There are no active problems to display for this patient.   History reviewed. No pertinent surgical history.     Home Medications    Prior to Admission medications   Medication Sig Start Date End Date Taking? Authorizing Provider  cetirizine (ZYRTEC) 10 MG tablet Take 1 tablet (10 mg total) by mouth daily. 04/03/18   Tasia Catchings, Ismail Graziani V, PA-C  diphenhydrAMINE (BENADRYL) 25 mg capsule Take 1 capsule (25 mg total) by mouth every 6 (six) hours as needed. Patient not taking: Reported on 12/19/2017 01/14/17   Tanna Furry, MD  fluticasone [facility]) 50 MCG/ACT nasal spray Place 2 sprays into both nostrils daily. 04/03/18   Tasia Catchings, Paylin Hailu V, PA-C  ibuprofen (ADVIL,MOTRIN) 800 MG tablet Take 1 tablet (800 mg total) by mouth 3 (three) times daily. Patient not taking: Reported on 12/19/2017 02/22/17   Barnet Glasgow, NP  ondansetron (ZOFRAN ODT) 4 MG disintegrating tablet Take 1 tablet (4 mg total) by mouth every 8 (eight) hours as needed for nausea or vomiting. Patient not taking: Reported on 02/05/2018 12/19/17   Kinnie Feil, PA-C    Family History Family History  Problem Relation Age of Onset  . Diabetes Mother   . Diabetes Father     Social History Social History   Tobacco Use  . Smoking status:  Current Every Day Smoker    Packs/day: 0.20    Types: Cigars  . Smokeless tobacco: Never Used  Substance Use Topics  . Alcohol use: No  . Drug use: Yes    Frequency: 7.0 times per week    Types: Marijuana     Allergies   Patient has no known allergies.   Review of Systems Review of Systems  Reason unable to perform ROS: See HPI as above.     Physical Exam Triage Vital Signs ED Triage Vitals  Enc Vitals Group     BP 04/03/18 2033 134/88     Pulse Rate 04/03/18 2033 74     Resp 04/03/18 2033 18     Temp 04/03/18 2033 98.9 F (37.2 C)     Temp Source 04/03/18 2033 Oral     SpO2 04/03/18 2033 100 %     Weight --      Height --      Head Circumference --      Peak Flow --      Pain Score 04/03/18 2031 9     Pain Loc --      Pain Edu? --      Excl. in [place]? --    No data found.  Updated Vital Signs BP 134/88 (BP Location: Left Arm)   Pulse 74   Temp 98.9 F (37.2 C) (Oral)   Resp 18   SpO2 100%   Visual  Acuity Right Eye Distance:   Left Eye Distance:   Bilateral Distance:    Right Eye Near:   Left Eye Near:    Bilateral Near:     Physical Exam  Constitutional: He is oriented to person, place, and time. He appears well-developed and well-nourished. No distress.  HENT:  Head: Normocephalic and atraumatic.  Right Ear: Tympanic membrane, external ear and ear canal normal. Tympanic membrane is not erythematous and not bulging.  Left Ear: Tympanic membrane, external ear and ear canal normal. Tympanic membrane is not erythematous and not bulging.  Nose: Rhinorrhea present. Right sinus exhibits no maxillary sinus tenderness and no frontal sinus tenderness. Left sinus exhibits no maxillary sinus tenderness and no frontal sinus tenderness.  Mouth/Throat: Uvula is midline, oropharynx is clear and moist and mucous membranes are normal.  Eyes: Pupils are equal, round, and reactive to light. Conjunctivae, EOM and lids are normal.  Neck: Normal range of motion. Neck  supple.  Cardiovascular: Normal rate, regular rhythm and normal heart sounds. Exam reveals no gallop and no friction rub.  No murmur heard. Pulmonary/Chest: Effort normal and breath sounds normal. He has no decreased breath sounds. He has no wheezes. He has no rhonchi. He has no rales.  Lymphadenopathy:    He has no cervical adenopathy.  Neurological: He is alert and oriented to person, place, and time. He has normal strength. He is not disoriented. No cranial nerve deficit or sensory deficit. He displays a negative Romberg sign. Coordination and gait normal. GCS eye subscore is 4. GCS verbal subscore is 5. GCS motor subscore is 6.  Normal finger to nose, rapid movement.  Skin: Skin is warm and dry.  Psychiatric: He has a normal mood and affect. His behavior is normal. Judgment normal.     UC Treatments / Results  Labs (all labs ordered are listed, but only abnormal results are displayed) Labs Reviewed - No data to display  EKG None Radiology No results found.  Procedures Procedures (including critical care time)  Medications Ordered in UC Medications  ketorolac (TORADOL) injection 60 mg (60 mg Intramuscular Given 04/03/18 2123)  metoCLOPramide (REGLAN) injection 5 mg (5 mg Intramuscular Given 04/03/18 2123)  dexamethasone (DECADRON) injection 10 mg (10 mg Intramuscular Given 04/03/18 2123)     Initial Impression / Assessment and Plan / UC Course  I have reviewed the triage vital signs and the nursing notes.  Pertinent labs & imaging results that were available during my care of the patient were reviewed by me and considered in my medical decision making (see chart for details).    Toradol, Reglan, Decadron injection in office today.  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  Start Zyrtec and Flonase as directed.  Return precautions given.  Patient expresses understanding and agrees to plan.  Final Clinical Impressions(s) / UC  Diagnoses   Final diagnoses:  Acute intractable headache, unspecified headache type    ED Discharge Orders        Ordered    cetirizine (ZYRTEC) 10 MG tablet  Daily     04/03/18 2113    fluticasone (FLONASE) 50 MCG/ACT nasal spray  Daily     04/03/18 2113        Ok Edwards, PA-C 04/03/18 2135

## 2018-07-21 ENCOUNTER — Ambulatory Visit (HOSPITAL_COMMUNITY)
Admission: EM | Admit: 2018-07-21 | Discharge: 2018-07-21 | Disposition: A | Discharge status: Home / Self Care | Payer: Self-pay | Attending: Family Medicine | Admitting: Family Medicine

## 2018-07-21 ENCOUNTER — Encounter (HOSPITAL_COMMUNITY): Payer: Self-pay | Admitting: Emergency Medicine

## 2018-07-21 DIAGNOSIS — K0889 Other specified disorders of teeth and supporting structures: Secondary | ICD-10-CM

## 2018-07-21 MED ORDER — MELOXICAM 7.5 MG PO TABS: 7.5000 mg | ORAL_TABLET | Freq: Every day | ORAL | 0 refills | Status: DC

## 2018-07-21 MED ORDER — AMOXICILLIN-POT CLAVULANATE 875-125 MG PO TABS: 1.0000 | ORAL_TABLET | Freq: Two times a day (BID) | ORAL | 0 refills | Status: DC

## 2018-07-21 NOTE — ED Triage Notes (Signed)
Pt sts left sided dental pain 

## 2018-07-21 NOTE — Discharge Instructions (Signed)
Start Augmentin as directed for dental infection. Mobic for pain. Do not take ibuprofen (motrin/advil)/ naproxen (aleve) while on mobic. You can take tylenol for additional pain relief. Follow up with dentist for further treatment and evaluation. If experiencing swelling of the throat, trouble breathing, trouble swallowing, leaning forward to breath, drooling, go to the emergency department for further evaluation.

## 2018-07-21 NOTE — ED Provider Notes (Signed)
[facility]    CSN: [id number] Arrival date & time: 07/21/18  1244     History   Chief Complaint Chief Complaint  Patient presents with  . Dental Pain    HPI Dennis Pope is a 32 y.o. male.   32 year old male comes in for 2 to 3-day history of left upper dental pain.  No known cracked tooth/caries to the area.  States some tenderness to the gums.   Denies swelling to throat, trouble breathing, trouble swallowing, tripoding, drooling.  Denies fever, chills, night sweats.  Denies facial swelling.  Denies URI symptoms such as cough, congestion, sore throat.  Has been taking ibuprofen intermittently with good relief. Has not seen a dentist.      History reviewed. No pertinent past medical history.  There are no active problems to display for this patient.   History reviewed. No pertinent surgical history.     Home Medications    Prior to Admission medications   Medication Sig Start Date End Date Taking? Authorizing Provider  amoxicillin-clavulanate (AUGMENTIN) 875-125 MG tablet Take 1 tablet by mouth every 12 (twelve) hours. 07/21/18   Tasia Catchings, Maverik Foot V, PA-C  cetirizine (ZYRTEC) 10 MG tablet Take 1 tablet (10 mg total) by mouth daily. 04/03/18   Tasia Catchings, Trulee Hamstra V, PA-C  diphenhydrAMINE (BENADRYL) 25 mg capsule Take 1 capsule (25 mg total) by mouth every 6 (six) hours as needed. Patient not taking: Reported on 12/19/2017 01/14/17   Tanna Furry, MD  fluticasone [facility]) 50 MCG/ACT nasal spray Place 2 sprays into both nostrils daily. 04/03/18   Tasia Catchings, Joscelynn Brutus V, PA-C  ibuprofen (ADVIL,MOTRIN) 800 MG tablet Take 1 tablet (800 mg total) by mouth 3 (three) times daily. Patient not taking: Reported on 12/19/2017 02/22/17   Barnet Glasgow, NP  meloxicam (MOBIC) 7.5 MG tablet Take 1 tablet (7.5 mg total) by mouth daily. 07/21/18   Tasia Catchings, Sabreena Vogan V, PA-C  ondansetron (ZOFRAN ODT) 4 MG disintegrating tablet Take 1 tablet (4 mg total) by mouth every 8 (eight) hours as needed for nausea or  vomiting. Patient not taking: Reported on 02/05/2018 12/19/17   Kinnie Feil, PA-C    Family History Family History  Problem Relation Age of Onset  . Diabetes Mother   . Diabetes Father     Social History Social History   Tobacco Use  . Smoking status: Current Every Day Smoker    Packs/day: 0.20    Types: Cigars  . Smokeless tobacco: Never Used  Substance Use Topics  . Alcohol use: No  . Drug use: Yes    Frequency: 7.0 times per week    Types: Marijuana     Allergies   Patient has no known allergies.   Review of Systems Review of Systems  Reason unable to perform ROS: See HPI as above.     Physical Exam Triage Vital Signs ED Triage Vitals [07/21/18 1322]  Enc Vitals Group     BP (!) 139/97     Pulse Rate 64     Resp 18     Temp 98.1 F (36.7 C)     Temp Source Oral     SpO2 100 %     Weight      Height      Head Circumference      Peak Flow      Pain Score      Pain Loc      Pain Edu?      Excl. in [place]?  No data found.  Updated Vital Signs BP (!) 139/97 (BP Location: Left Arm)   Pulse 64   Temp 98.1 F (36.7 C) (Oral)   Resp 18   SpO2 100%   Physical Exam  Constitutional: He is oriented to person, place, and time. He appears well-developed and well-nourished. No distress.  HENT:  Head: Normocephalic and atraumatic.  Mouth/Throat: Uvula is midline, oropharynx is clear and moist and mucous membranes are normal. No trismus in the jaw. No uvula swelling. No tonsillar exudate.    Tenderness to palpation around surrounding gums. No obvious cracked tooth, caries seen.   No obvious fluctuance felt.   Floor of mouth soft to palpation. No obvious facial swelling.   Neck: Normal range of motion. Neck supple.  Neurological: He is alert and oriented to person, place, and time.  Skin: Skin is warm and dry. He is not diaphoretic.     UC Treatments / Results  Labs (all labs ordered are listed, but only abnormal results are displayed) Labs  Reviewed - No data to display  EKG None  Radiology No results found.  Procedures Procedures (including critical care time)  Medications Ordered in UC Medications - No data to display  Initial Impression / Assessment and Plan / UC Course  I have reviewed the triage vital signs and the nursing notes.  Pertinent labs & imaging results that were available during my care of the patient were reviewed by me and considered in my medical decision making (see chart for details).    Start antibiotics for possible dental infection. Symptomatic treatment as needed. Discussed with patient symptoms can return if dental problem is not addressed. Follow up with dentist for further evaluation and treatment of dental pain. Resources given. Return precautions given.   Final Clinical Impressions(s) / UC Diagnoses   Final diagnoses:  Pain, dental    ED Prescriptions    Medication Sig Dispense Auth. Provider   amoxicillin-clavulanate (AUGMENTIN) 875-125 MG tablet Take 1 tablet by mouth every 12 (twelve) hours. 14 tablet Nicolaas Savo V, PA-C   meloxicam (MOBIC) 7.5 MG tablet Take 1 tablet (7.5 mg total) by mouth daily. 15 tablet Tobin Chad, [place] 07/21/18 1355

## 2018-10-02 ENCOUNTER — Other Ambulatory Visit: Payer: Self-pay

## 2018-10-02 ENCOUNTER — Ambulatory Visit (HOSPITAL_COMMUNITY)
Admission: EM | Admit: 2018-10-02 | Discharge: 2018-10-02 | Disposition: A | Discharge status: Home / Self Care | Payer: Self-pay | Attending: Family Medicine | Admitting: Family Medicine

## 2018-10-02 ENCOUNTER — Encounter (HOSPITAL_COMMUNITY): Payer: Self-pay | Admitting: Emergency Medicine

## 2018-10-02 DIAGNOSIS — K0889 Other specified disorders of teeth and supporting structures: Secondary | ICD-10-CM

## 2018-10-02 MED ORDER — PENICILLIN V POTASSIUM 500 MG PO TABS: 500.0000 mg | ORAL_TABLET | Freq: Three times a day (TID) | ORAL | 0 refills | Status: DC

## 2018-10-02 MED ORDER — HYDROCODONE-ACETAMINOPHEN 5-325 MG PO TABS: 1.0000 | ORAL_TABLET | Freq: Four times a day (QID) | ORAL | 0 refills | Status: DC | PRN

## 2018-10-02 NOTE — ED Triage Notes (Signed)
Top, left tooth started hurting 2 days ago

## 2018-10-02 NOTE — ED Provider Notes (Signed)
[place] [place]    CSN: [id number] Arrival date & time: 10/02/18  1943     History   Chief Complaint Chief Complaint  Patient presents with  . Dental Pain    HPI Dennis Pope is a 32 y.o. male.   Top, left tooth started hurting 2 days ago     History reviewed. No pertinent past medical history.  There are no active problems to display for this patient.   History reviewed. No pertinent surgical history.     Home Medications    Prior to Admission medications   Medication Sig Start Date End Date Taking? Authorizing Provider  HYDROcodone-acetaminophen (NORCO) 5-325 MG tablet Take 1-2 tablets by mouth every 6 (six) hours as needed for moderate pain. 10/02/18   Robyn Haber, MD  penicillin v potassium (VEETID) 500 MG tablet Take 1 tablet (500 mg total) by mouth 3 (three) times daily. 10/02/18   Robyn Haber, MD    Family History Family History  Problem Relation Age of Onset  . Diabetes Mother   . Diabetes Father     Social History Social History   Tobacco Use  . Smoking status: Current Every Day Smoker    Packs/day: 0.20    Types: Cigars  . Smokeless tobacco: Never Used  Substance Use Topics  . Alcohol use: No  . Drug use: Yes    Frequency: 7.0 times per week    Types: Marijuana     Allergies   Patient has no known allergies.   Review of Systems Review of Systems  HENT: Positive for dental problem.   All other systems reviewed and are negative.    Physical Exam Triage Vital Signs ED Triage Vitals  Enc Vitals Group     BP 10/02/18 2001 138/83     Pulse Rate 10/02/18 2000 75     Resp 10/02/18 2000 20     Temp 10/02/18 2000 98.5 F (36.9 C)     Temp Source 10/02/18 2000 Oral     SpO2 10/02/18 2000 99 %     Weight --      Height --      Head Circumference --      Peak Flow --      Pain Score 10/02/18 1958 10     Pain Loc --      Pain Edu? --      Excl. in [place]? --    No data found.  Updated Vital Signs BP 138/83    Pulse 75   Temp 98.5 F (36.9 C) (Oral)   Resp 20   SpO2 99%    Physical Exam  Constitutional: He is oriented to person, place, and time. He appears well-developed and well-nourished.  HENT:  Right Ear: External ear normal.  Left Ear: External ear normal.  Mouth/Throat: Oropharynx is clear and moist.  Tender tooth #11 with palpation or percussion  Eyes: Conjunctivae are normal.  Neck: Normal range of motion. Neck supple.  Pulmonary/Chest: Effort normal.  Musculoskeletal: Normal range of motion.  Neurological: He is alert and oriented to person, place, and time.  Skin: Skin is warm and dry.  Nursing note and vitals reviewed.    UC Treatments / Results  Labs (all labs ordered are listed, but only abnormal results are displayed) Labs Reviewed - No data to display  EKG None  Radiology No results found.  Procedures Procedures (including critical care time)  Medications Ordered in UC Medications - No data to display  Initial Impression /  Assessment and Plan / UC Course  I have reviewed the triage vital signs and the nursing notes.  Pertinent labs & imaging results that were available during my care of the patient were reviewed by me and considered in my medical decision making (see chart for details).     Final Clinical Impressions(s) / UC Diagnoses   Final diagnoses:  Pain, dental     Discharge Instructions     If you do not quit smoking, you will continue to have this kind of problem with rest of your life.    ED Prescriptions    Medication Sig Dispense Auth. Provider   HYDROcodone-acetaminophen (NORCO) 5-325 MG tablet Take 1-2 tablets by mouth every 6 (six) hours as needed for moderate pain. 10 tablet Robyn Haber, MD   penicillin v potassium (VEETID) 500 MG tablet Take 1 tablet (500 mg total) by mouth 3 (three) times daily. 30 tablet Robyn Haber, MD     Controlled Substance Prescriptions  Controlled Substance Registry consulted? No     Robyn Haber, MD 10/02/18 2023

## 2018-10-02 NOTE — Discharge Instructions (Signed)
If you do not quit smoking, you will continue to have this kind of problem with rest of your life.

## 2018-10-03 ENCOUNTER — Encounter (HOSPITAL_COMMUNITY): Payer: Self-pay

## 2018-10-03 ENCOUNTER — Emergency Department (HOSPITAL_COMMUNITY)
Admission: EM | Admit: 2018-10-03 | Discharge: 2018-10-03 | Disposition: A | Discharge status: Home / Self Care | Payer: Self-pay | Attending: Emergency Medicine | Admitting: Emergency Medicine

## 2018-10-03 DIAGNOSIS — K029 Dental caries, unspecified: Secondary | ICD-10-CM | POA: Insufficient documentation

## 2018-10-03 DIAGNOSIS — F17290 Nicotine dependence, other tobacco product, uncomplicated: Secondary | ICD-10-CM | POA: Insufficient documentation

## 2018-10-03 DIAGNOSIS — Z720 Tobacco use: Secondary | ICD-10-CM

## 2018-10-03 NOTE — Discharge Instructions (Signed)
You have been seen by your caregiver because of dental pain.  °SEEK MEDICAL ATTENTION IF: °The exam and treatment you received today has been provided on an emergency basis only. This is not a substitute for complete medical or dental care. If your problem worsens or new symptoms (problems) appear, and you are unable to arrange prompt follow-up care with your dentist, call or return to this location. °CALL YOUR DENTIST OR RETURN IMMEDIATELY IF you develop a fever, rash, difficulty breathing or swallowing, neck or facial swelling, or other potentially serious concerns. ° ° °[employer]  °[employer]  °[facility]  °[address]  °[place], [place] [address]  °Phone [phone]  °[facility] [employer] [facility] in [place], [place], exemplifies the Dental School?s vision to improve the health and quality of life of all [place] by creating leaders with a passion to care for the underserved and by leading the nation in community-based, service learning oral health education. °We are committed to offering comprehensive general dental services for adults, children and special needs patients in a safe, caring and professional setting. ° °Appointments: Our clinic is open Monday through Friday 8:00 a.m. until 5:00 p.m. The amount of time scheduled for an appointment depends on the patient?s specific needs. We ask that you keep your appointed time for care or provide 24-hour notice of all appointment changes. Parents or legal guardians must accompany minor children. ° °Payment for Services: Medicaid and other insurance plans are welcome. Payment for services is due when services are rendered and may be made by cash or credit card. If you have dental insurance, we will assist you with your claim submission.  ° °Emergencies: Emergency services will be provided Monday through Friday on a  walk-in basis. Please arrive early for emergency services. After hours emergency services will be provided for patients of record as required. ° °Services:  °Comprehensive General Dentistry  °Children?s Dentistry  °Oral Surgery - Extractions  °Root Canals  °Sealants and Tooth Colored Fillings  °Crowns and Bridges  °Dentures and Partial Dentures  °Implant Services  °Periodontal Services and Cleanings  °Cosmetic Tooth Whitening  °Digital Radiography  °3-D/Cone Beam Imaging ° ° °

## 2018-10-03 NOTE — ED Provider Notes (Signed)
[facility] EMERGENCY DEPARTMENT Provider Note   CSN: [id number] Arrival date & time: 10/03/18  1807     History   Chief Complaint Chief Complaint  Patient presents with  . Dental Pain    HPI Dennis Pope is a 32 y.o. male who presents requesting a local block for tooth pain.  The patient was seen yesterday at [facility] urgent care for 2 days of throbbing pain in his left upper canine.  Patient states that he took the prescribed penicillin and has improved however he did not have his wallet with him and did not get the pain medication.  He is still having throbbing pain but states that it is somewhat better than yesterday.  He denies any difficulty swallowing, changes in voice, shortness of breath.  HPI  History reviewed. No pertinent past medical history.  There are no active problems to display for this patient.   History reviewed. No pertinent surgical history.      Home Medications    Prior to Admission medications   Medication Sig Start Date End Date Taking? Authorizing Provider  HYDROcodone-acetaminophen (NORCO) 5-325 MG tablet Take 1-2 tablets by mouth every 6 (six) hours as needed for moderate pain. 10/02/18   Robyn Haber, MD  penicillin v potassium (VEETID) 500 MG tablet Take 1 tablet (500 mg total) by mouth 3 (three) times daily. 10/02/18   Robyn Haber, MD    Family History Family History  Problem Relation Age of Onset  . Diabetes Mother   . Diabetes Father     Social History Social History   Tobacco Use  . Smoking status: Current Every Day Smoker    Packs/day: 0.20    Types: Cigars  . Smokeless tobacco: Never Used  Substance Use Topics  . Alcohol use: No  . Drug use: Yes    Frequency: 7.0 times per week    Types: Marijuana     Allergies   Patient has no known allergies.   Review of Systems Review of Systems  Constitutional: Negative for chills and fever.  HENT: Positive for dental problem. Negative for  trouble swallowing and voice change.      Physical Exam Updated Vital Signs BP (!) 130/99 (BP Location: Right Arm)   Pulse 74   Temp 99.3 F (37.4 C) (Oral)   Resp 16   SpO2 100%   Physical Exam  Constitutional: He appears well-developed and well-nourished. No distress.  HENT:  Head: Normocephalic and atraumatic.  Mouth/Throat: Dental caries present.    Eyes: Conjunctivae are normal. No scleral icterus.  Neck: Normal range of motion. Neck supple.  Cardiovascular: Normal rate, regular rhythm and normal heart sounds.  Pulmonary/Chest: Effort normal and breath sounds normal. No respiratory distress.  Abdominal: Soft. There is no tenderness.  Musculoskeletal: He exhibits no edema.  Neurological: He is alert.  Skin: Skin is warm and dry. He is not diaphoretic.  Psychiatric: His behavior is normal.  Nursing note and vitals reviewed.    ED Treatments / Results  Labs (all labs ordered are listed, but only abnormal results are displayed) Labs Reviewed - No data to display  EKG None  Radiology No results found.  Procedures Procedures (including critical care time)  Dental Performed by: Margarita Mail Authorized by: Margarita Mail Consent: Verbal consent obtained. Patient understanding: patient states understanding of the procedure being performed Patient identity confirmed: verbally with patient Local anesthesia used: yes Local anesthetic: bupivacaine 0.5% with epinephrine Anesthetic total: 1.8 ml Patient sedated: no  Patient tolerance: Patient tolerated the procedure well with no immediate complications.    Medications Ordered in ED Medications - No data to display   Initial Impression / Assessment and Plan / ED Course  I have reviewed the triage vital signs and the nursing notes.  Pertinent labs & imaging results that were available during my care of the patient were reviewed by me and considered in my medical decision making (see chart for details).       With dental pain from dental carry.  No evidence of Ludwig's angina or dental abscess.  Complete anesthesia achieved with supraperiosteal infiltration. Patient has prescriptions for pain medication which he will fill he is currently taking penicillin.  Final Clinical Impressions(s) / ED Diagnoses   Final diagnoses:  Pain due to dental caries  Tobacco abuse    ED Discharge Orders    None       Margarita Mail, PA-C 10/03/18 1906    Lennice Sites, DO 10/04/18 6415

## 2018-10-03 NOTE — ED Triage Notes (Signed)
Pt presents for evaluation of L sided dental pain. States has been seen at [facility] for same and was given medication. States he would like his tooth numb.

## 2018-10-27 ENCOUNTER — Telehealth: Payer: Self-pay | Admitting: Emergency Medicine

## 2018-10-27 NOTE — Telephone Encounter (Signed)
Lost to followup 

## 2018-11-05 ENCOUNTER — Other Ambulatory Visit: Payer: Self-pay

## 2018-11-05 ENCOUNTER — Emergency Department (HOSPITAL_COMMUNITY)
Admission: EM | Admit: 2018-11-05 | Discharge: 2018-11-05 | Disposition: A | Discharge status: Home / Self Care | Payer: Self-pay | Attending: Emergency Medicine | Admitting: Emergency Medicine

## 2018-11-05 ENCOUNTER — Encounter (HOSPITAL_COMMUNITY): Payer: Self-pay

## 2018-11-05 DIAGNOSIS — F17210 Nicotine dependence, cigarettes, uncomplicated: Secondary | ICD-10-CM | POA: Insufficient documentation

## 2018-11-05 DIAGNOSIS — K0889 Other specified disorders of teeth and supporting structures: Secondary | ICD-10-CM

## 2018-11-05 MED ORDER — PENICILLIN V POTASSIUM 500 MG PO TABS: 500.0000 mg | ORAL_TABLET | Freq: Three times a day (TID) | ORAL | 0 refills | Status: DC

## 2018-11-05 MED ORDER — BUPIVACAINE HCL 0.25 % IJ SOLN
10.0000 mL | Freq: Once | INTRAMUSCULAR | Status: DC
  Filled 2018-11-05 (×2): qty 10

## 2018-11-05 NOTE — Discharge Instructions (Addendum)
It is very important that you get evaluated by a dentist as soon as possible. Call tomorrow to schedule an appointment. Ibuprofen as needed for pain. Take your full course of antibiotics. Read the instructions below.  Eat a soft or liquid diet and rinse your mouth out after meals with warm water. You should see a dentist or return here at once if you have increased swelling, increased pain or uncontrolled bleeding from the site of your injury.  SEEK MEDICAL CARE IF:  You have a fever >101 New or worsening symptoms, any additional concerns.

## 2018-11-05 NOTE — ED Notes (Signed)
Patient verbalizes understanding of discharge instructions. Opportunity for questioning and answers were provided. Armband removed by staff, pt discharged from ED ambulatory by self\  

## 2018-11-05 NOTE — ED Triage Notes (Signed)
Pt arrives via POV for eval of Lsided mouth pain onset 3 hours PTA. Pt reports hx of dental abscesses. Pt reports "I already knew what it was so I came in".

## 2018-11-05 NOTE — ED Provider Notes (Signed)
[facility] Emergency Department Provider Note MRN:  [id number]  [place] date & time: 11/05/18     Chief Complaint   Dental Pain   History of Present Illness   Dennis Pope is a 32 y.o. year-old male with a history of recurrent tooth pain presenting to the ED with chief complaint of tooth pain.  Patient explains that he was told he needs a root canal, but does not have the money for the procedure at this time.  Has had recurrent tooth pain for several weeks to months.  Pain began again early this afternoon, gradual onset, progressively worsening.  Located in the left maxillary premolars, constant.  Denies fever, no shortness of breath, no other symptoms.  Review of Systems  A problem-focused ROS was performed. Positive for tooth pain.  Patient denies fever.  Patient's Health History   History reviewed. No pertinent past medical history.  History reviewed. No pertinent surgical history.  Family History  Problem Relation Age of Onset  . Diabetes Mother   . Diabetes Father     Social History   Socioeconomic History  . Marital status: Single    Spouse name: Not on file  . Number of children: Not on file  . Years of education: Not on file  . Highest education level: Not on file  Occupational History  . Not on file  Social Needs  . Financial resource strain: Not on file  . Food insecurity:    Worry: Not on file    Inability: Not on file  . Transportation needs:    Medical: Not on file    Non-medical: Not on file  Tobacco Use  . Smoking status: Current Every Day Smoker    Packs/day: 0.20    Types: Cigars  . Smokeless tobacco: Never Used  Substance and Sexual Activity  . Alcohol use: No  . Drug use: Yes    Frequency: 7.0 times per week    Types: Marijuana  . Sexual activity: Not on file  Lifestyle  . Physical activity:    Days per week: Not on file    Minutes per session: Not on file  . Stress: Not on file  Relationships  . Social connections:     Talks on phone: Not on file    Gets together: Not on file    Attends religious service: Not on file    Active member of club or organization: Not on file    Attends meetings of clubs or organizations: Not on file    Relationship status: Not on file  . Intimate partner violence:    Fear of current or ex partner: Not on file    Emotionally abused: Not on file    Physically abused: Not on file    Forced sexual activity: Not on file  Other Topics Concern  . Not on file  Social History Narrative   ** Merged History Encounter **         Physical Exam  Vital Signs and Nursing Notes reviewed Vitals:   11/05/18 2010 11/05/18 2236  BP: (!) 140/101 (!) 140/98  Pulse: (!) 57 60  Resp: 16 16  Temp: 98.1 F (36.7 C)   SpO2: 97% 97%    CONSTITUTIONAL: Well-appearing, NAD NEURO:  Alert and oriented x 3, no focal deficits EYES:  eyes equal and reactive ENT/NECK:  no LAD, no JVD; tender to palpation to the left maxillary premolars with no gingival abscess CARDIO: Regular rate, well-perfused, normal S1 and S2  PULM:  CTAB no wheezing or rhonchi GI/GU:  normal bowel sounds, non-distended, non-tender MSK/SPINE:  No gross deformities, no edema SKIN:  no rash, atraumatic PSYCH:  Appropriate speech and behavior  Diagnostic and Interventional Summary    Labs Reviewed - No data to display  No orders to display    Medications  bupivacaine (MARCAINE) 0.25 % (with pres) injection 10 mL (has no administration in time range)     Procedures Critical Care  ED Course and Medical Decision Making  I have reviewed the triage vital signs and the nursing notes.  Pertinent labs & imaging results that were available during my care of the patient were reviewed by me and considered in my medical decision making (see below for details).  Uncomplicated tooth pain, will provide dental block, prescription for antibiotics.  Dental block performed by PA, discharged with instruction to follow-up with  dentistry.  After the discussed management above, the patient was determined to be safe for discharge.  The patient was in agreement with this plan and all questions regarding their care were answered.  ED return precautions were discussed and the patient will return to the ED with any significant worsening of condition.  [name], [place] [email or web address]  Final Clinical Impressions(s) / ED Diagnoses     ICD-10-CM   1. Pain, dental K08.89     ED Discharge Orders         Ordered    penicillin v potassium (VEETID) 500 MG tablet  3 times daily     11/05/18 2239             Maudie Flakes, MD 11/05/18 2351

## 2018-11-05 NOTE — ED Provider Notes (Signed)
.  Nerve Block Date/Time: 11/05/2018 10:35 PM Performed by: Julies Carmickle, Ozella Almond, PA-C Authorized by: Verlyn Lambert, Ozella Almond, PA-C   Consent:    Consent obtained:  Verbal   Consent given by:  Patient   Risks discussed:  Bleeding, infection, pain, nerve damage, swelling and unsuccessful block Indications:    Indications:  Pain relief Location:    Nerve block body site: Dental.   Laterality:  Left Procedure details (see MAR for exact dosages):    Block needle gauge:  27 G   Anesthetic injected:  Bupivacaine 0.5% w/o epi Post-procedure details:    Outcome:  Pain relieved   Patient tolerance of procedure:  Tolerated well, no immediate complications       Makena Mcgrady, Ozella Almond, PA-C 11/05/18 2236    Maudie Flakes, MD 11/06/18 Dyann Kief

## 2018-11-06 ENCOUNTER — Emergency Department (HOSPITAL_COMMUNITY)
Admission: EM | Admit: 2018-11-06 | Discharge: 2018-11-06 | Disposition: A | Discharge status: Home / Self Care | Payer: Self-pay | Attending: Emergency Medicine | Admitting: Emergency Medicine

## 2018-11-06 ENCOUNTER — Encounter (HOSPITAL_COMMUNITY): Payer: Self-pay

## 2018-11-06 DIAGNOSIS — F17210 Nicotine dependence, cigarettes, uncomplicated: Secondary | ICD-10-CM | POA: Insufficient documentation

## 2018-11-06 DIAGNOSIS — K0889 Other specified disorders of teeth and supporting structures: Secondary | ICD-10-CM | POA: Insufficient documentation

## 2018-11-06 DIAGNOSIS — Z79899 Other long term (current) drug therapy: Secondary | ICD-10-CM | POA: Insufficient documentation

## 2018-11-06 MED ORDER — BUPIVACAINE-EPINEPHRINE (PF) 0.25% -1:200000 IJ SOLN
10.0000 mL | Freq: Once | INTRAMUSCULAR | Status: DC
  Filled 2018-11-06: qty 30

## 2018-11-06 MED ORDER — IBUPROFEN 400 MG PO TABS
600.0000 mg | ORAL_TABLET | Freq: Once | ORAL | Status: AC
  Administered 2018-11-06: 600 mg via ORAL
  Filled 2018-11-06: qty 1

## 2018-11-06 MED ORDER — IBUPROFEN 600 MG PO TABS: 600.0000 mg | ORAL_TABLET | Freq: Four times a day (QID) | ORAL | 0 refills | Status: DC | PRN

## 2018-11-06 MED ORDER — LIDOCAINE-EPINEPHRINE 2 %-1:50000 IJ SOLN: 1.7000 mL | Freq: Once | INTRAMUSCULAR | Status: DC

## 2018-11-06 NOTE — ED Notes (Signed)
Patient verbalizes understanding of discharge instructions. Opportunity for questioning and answers were provided. Armband removed by staff, pt discharged from ED ambulatory.   

## 2018-11-06 NOTE — ED Triage Notes (Addendum)
Pt arrived POV d/t L. Sided dental pain, prev here for same issue on 11/05/18.

## 2018-11-06 NOTE — ED Notes (Signed)
ED Provider at bedside. 

## 2018-11-06 NOTE — Discharge Instructions (Addendum)
Please follow up with a dentist for further treatment of the problem tooth.

## 2018-11-06 NOTE — ED Provider Notes (Signed)
[place] EMERGENCY DEPARTMENT Provider Note   CSN: [id number] Arrival date & time: 11/06/18  1731     History   Chief Complaint No chief complaint on file.   HPI Dennis Pope is a 32 y.o. male.  HPI  32 year old male presents with left maxillary dental pain.  This pain has been on and off for a long time but over the last couple days is more severe.  He has a dental appointment for tomorrow morning at 11 AM.  He has been to the ED twice in the last 24 hours and received dental blocks.  He states that they provide relief but then now are wearing off and he is requesting 1 more to get him through the night for the appointment tomorrow.  He feels like the gum is slightly swollen next of the tooth but has had no facial swelling, fever, or trouble breathing/swallowing.  Has not taken anything for the pain.  He is taking the antibiotics prescribed to him on his first visit.  History reviewed. No pertinent past medical history.  There are no active problems to display for this patient.   History reviewed. No pertinent surgical history.      Home Medications    Prior to Admission medications   Medication Sig Start Date End Date Taking? Authorizing Provider  HYDROcodone-acetaminophen (NORCO) 5-325 MG tablet Take 1-2 tablets by mouth every 6 (six) hours as needed for moderate pain. 10/02/18   Robyn Haber, MD  ibuprofen (ADVIL,MOTRIN) 600 MG tablet Take 1 tablet (600 mg total) by mouth every 6 (six) hours as needed. 11/06/18   Charlann Lange, PA-C  penicillin v potassium (VEETID) 500 MG tablet Take 1 tablet (500 mg total) by mouth 3 (three) times daily. 11/05/18   Ward, Ozella Almond, PA-C    Family History Family History  Problem Relation Age of Onset  . Diabetes Mother   . Diabetes Father     Social History Social History   Tobacco Use  . Smoking status: Current Every Day Smoker    Packs/day: 0.20    Types: Cigars  . Smokeless tobacco: Never  Used  Substance Use Topics  . Alcohol use: No  . Drug use: Yes    Frequency: 7.0 times per week    Types: Marijuana     Allergies   Patient has no known allergies.   Review of Systems Review of Systems  Constitutional: Negative for fever.  HENT: Positive for dental problem. Negative for facial swelling.   Respiratory: Negative for shortness of breath.      Physical Exam Updated Vital Signs BP (!) 146/93 (BP Location: Right Arm)   Pulse 75   Temp 98.6 F (37 C) (Oral)   Resp 16   Ht 5\' 10"  (1.778 m)   Wt 68 kg   SpO2 100%   BMI 21.52 kg/m   Physical Exam  Constitutional: He appears well-developed and well-nourished.  HENT:  Head: Normocephalic and atraumatic.  Right Ear: External ear normal.  Left Ear: External ear normal.  Nose: Nose normal.  Mouth/Throat: Oropharynx is clear and moist. No oropharyngeal exudate.    No swelling to floor of mouth. No stridor  Eyes: Right eye exhibits no discharge. Left eye exhibits no discharge.  Neck: Neck supple.  Pulmonary/Chest: Effort normal.  Abdominal: He exhibits no distension.  Neurological: He is alert.  Skin: Skin is warm and dry.  Psychiatric: His mood appears not anxious.  Nursing note and vitals reviewed.  ED Treatments / Results  Labs (all labs ordered are listed, but only abnormal results are displayed) Labs Reviewed - No data to display  EKG None  Radiology No results found.  Procedures .Nerve Block Date/Time: 11/06/2018 7:08 PM Performed by: Sherwood Gambler, MD Authorized by: Sherwood Gambler, MD   Consent:    Consent obtained:  Verbal   Consent given by:  Patient   Risks discussed:  Bleeding, intravenous injection, infection, nerve damage, pain, unsuccessful block and swelling   Alternatives discussed:  Alternative treatment Indications:    Indications:  Pain relief Location:    Body area:  Head   Head nerve blocked: infraorbital.   Laterality:  Left Procedure details (see MAR for  exact dosages):    Block needle gauge:  27 G   Anesthetic injected:  Bupivacaine 0.5% WITH epi   Steroid injected:  None   Injection procedure:  Anatomic landmarks identified and anatomic landmarks palpated   Paresthesia:  None Post-procedure details:    Dressing:  None   Outcome:  Anesthesia achieved   Patient tolerance of procedure:  Tolerated well, no immediate complications   (including critical care time)  Medications Ordered in ED Medications  bupivacaine-epinephrine (MARCAINE W/ EPI) 0.25% -1:200000 injection 10 mL (has no administration in time range)  ibuprofen (ADVIL,MOTRIN) tablet 600 mg (has no administration in time range)     Initial Impression / Assessment and Plan / ED Course  I have reviewed the triage vital signs and the nursing notes.  Pertinent labs & imaging results that were available during my care of the patient were reviewed by me and considered in my medical decision making (see chart for details).     Patient requesting repeat dental block for pain control.  Given the very small amounts of bupivacaine he is been given over the last 2 visits, my suspicion of causing toxicity is very low.  I discussed with pharmacy who agrees.  He was given infraorbital block as above with good results.  Also given ibuprofen.  Final Clinical Impressions(s) / ED Diagnoses   Final diagnoses:  Pain, dental    ED Discharge Orders    None       Sherwood Gambler, MD 11/06/18 [phone]

## 2018-11-06 NOTE — ED Provider Notes (Signed)
[place] EMERGENCY DEPARTMENT Provider Note   CSN: [id number] Arrival date & time: 11/06/18  0407     History   Chief Complaint Chief Complaint  Patient presents with  . Dental Pain    HPI Dennis Pope is a 32 y.o. male.  Patient returns to the ED for left upper dental pain. This has been an ongoing issue for him and he has been told he needs a root canal. He has been unable to follow up due to financial contraints. No facial swelling or fever. Seen within the last 24 hours for same in the emergency department.   The history is provided by the patient. No language interpreter was used.  Dental Pain      History reviewed. No pertinent past medical history.  There are no active problems to display for this patient.   History reviewed. No pertinent surgical history.      Home Medications    Prior to Admission medications   Medication Sig Start Date End Date Taking? Authorizing Provider  HYDROcodone-acetaminophen (NORCO) 5-325 MG tablet Take 1-2 tablets by mouth every 6 (six) hours as needed for moderate pain. 10/02/18   Robyn Haber, MD  penicillin v potassium (VEETID) 500 MG tablet Take 1 tablet (500 mg total) by mouth 3 (three) times daily. 11/05/18   Ward, Ozella Almond, PA-C    Family History Family History  Problem Relation Age of Onset  . Diabetes Mother   . Diabetes Father     Social History Social History   Tobacco Use  . Smoking status: Current Every Day Smoker    Packs/day: 0.20    Types: Cigars  . Smokeless tobacco: Never Used  Substance Use Topics  . Alcohol use: No  . Drug use: Yes    Frequency: 7.0 times per week    Types: Marijuana     Allergies   Patient has no known allergies.   Review of Systems Review of Systems  Constitutional: Negative for chills and fever.  HENT: Positive for dental problem. Negative for facial swelling and trouble swallowing.   Gastrointestinal: Negative for nausea.    Musculoskeletal: Negative for neck stiffness.  Neurological: Negative for headaches.     Physical Exam Updated Vital Signs BP (!) 131/92   Pulse (!) 54   Temp 98.2 F (36.8 C) (Oral)   Resp 18   Ht 5\' 10"  (1.778 m)   Wt 68 kg   SpO2 100%   BMI 21.52 kg/m   Physical Exam  Constitutional: He is oriented to person, place, and time. He appears well-developed and well-nourished.  HENT:  Generally good dentition without significant erosion or caries. #12 tender to palpation without surrounding swelling or visualized abscess.  Neck: Normal range of motion.  Pulmonary/Chest: Effort normal.  Musculoskeletal: Normal range of motion.  Neurological: He is alert and oriented to person, place, and time.  Skin: Skin is warm and dry.  Psychiatric: He has a normal mood and affect.     ED Treatments / Results  Labs (all labs ordered are listed, but only abnormal results are displayed) Labs Reviewed - No data to display  EKG None  Radiology No results found.  Procedures Procedures (including critical care time)  Medications Ordered in ED Medications  ibuprofen (ADVIL,MOTRIN) tablet 600 mg (600 mg Oral Given 11/06/18 0447)     Initial Impression / Assessment and Plan / ED Course  I have reviewed the triage vital signs and the nursing notes.  Pertinent labs &  imaging results that were available during my care of the patient were reviewed by me and considered in my medical decision making (see chart for details).     Patient here for control of dental pain - requesting an apical block. He received this on previous visit yesterday and is requesting the same.   Dental block performed. Dental resources provided.   Final Clinical Impressions(s) / ED Diagnoses   Final diagnoses:  None   1. Dental pain  ED Discharge Orders    None       Charlann Lange, PA-C 11/06/18 0510    Mesner, Corene Cornea, MD 11/06/18 [phone]

## 2018-11-06 NOTE — ED Triage Notes (Signed)
Pt. Coming from home with reports of left upper dental pain. Pt. Reports having a dental appointment tomorrow but cannot bear the pain. A/O X4

## 2019-02-14 ENCOUNTER — Emergency Department (HOSPITAL_COMMUNITY)
Admission: EM | Admit: 2019-02-14 | Discharge: 2019-02-14 | Disposition: A | Discharge status: Home / Self Care | Payer: Self-pay | Attending: Emergency Medicine | Admitting: Emergency Medicine

## 2019-02-14 ENCOUNTER — Other Ambulatory Visit: Payer: Self-pay

## 2019-02-14 ENCOUNTER — Encounter (HOSPITAL_COMMUNITY): Payer: Self-pay

## 2019-02-14 DIAGNOSIS — K0889 Other specified disorders of teeth and supporting structures: Secondary | ICD-10-CM | POA: Insufficient documentation

## 2019-02-14 DIAGNOSIS — Z79899 Other long term (current) drug therapy: Secondary | ICD-10-CM | POA: Insufficient documentation

## 2019-02-14 DIAGNOSIS — F17210 Nicotine dependence, cigarettes, uncomplicated: Secondary | ICD-10-CM | POA: Insufficient documentation

## 2019-02-14 MED ORDER — IBUPROFEN 800 MG PO TABS: 800.0000 mg | ORAL_TABLET | Freq: Three times a day (TID) | ORAL | 0 refills | Status: DC | PRN

## 2019-02-14 MED ORDER — IBUPROFEN 800 MG PO TABS
800.0000 mg | ORAL_TABLET | Freq: Once | ORAL | Status: AC
  Administered 2019-02-14: 800 mg via ORAL
  Filled 2019-02-14: qty 1

## 2019-02-14 NOTE — ED Notes (Signed)
Pt refused discharge vitals and to sign e-sign discharge. Pt left in stable condition.

## 2019-02-14 NOTE — Discharge Instructions (Signed)
Take motrin for pain.   See your dentist   Return to ER if you have worse pain, swelling, trouble breathing.

## 2019-02-14 NOTE — ED Provider Notes (Signed)
[place] EMERGENCY DEPARTMENT Provider Note   CSN: [id number] Arrival date & time: 02/14/19  1901    History   Chief Complaint Chief Complaint  Patient presents with  . Dental Pain    HPI Dennis Pope is a 33 y.o. male report dentition here presenting with dental pain.  Patient states that he ate something very hot and spicy yesterday and started having gum pain and dental pain.  Patient states that he just had a tooth pulled 2 weeks ago by dentist.  He did take his amoxicillin at home.  He states that ibuprofen usually helps with the pain but he ran out of it.  Denies any trouble swallowing or fevers or trouble breathing.     The history is provided by the patient.    History reviewed. No pertinent past medical history.  There are no active problems to display for this patient.   History reviewed. No pertinent surgical history.      Home Medications    Prior to Admission medications   Medication Sig Start Date End Date Taking? Authorizing Provider  HYDROcodone-acetaminophen (NORCO) 5-325 MG tablet Take 1-2 tablets by mouth every 6 (six) hours as needed for moderate pain. 10/02/18   Robyn Haber, MD  ibuprofen (ADVIL,MOTRIN) 600 MG tablet Take 1 tablet (600 mg total) by mouth every 6 (six) hours as needed. 11/06/18   Charlann Lange, PA-C  penicillin v potassium (VEETID) 500 MG tablet Take 1 tablet (500 mg total) by mouth 3 (three) times daily. 11/05/18   Ward, Ozella Almond, PA-C    Family History Family History  Problem Relation Age of Onset  . Diabetes Mother   . Diabetes Father     Social History Social History   Tobacco Use  . Smoking status: Current Every Day Smoker    Packs/day: 0.20    Types: Cigars  . Smokeless tobacco: Never Used  Substance Use Topics  . Alcohol use: No  . Drug use: Yes    Frequency: 7.0 times per week    Types: Marijuana     Allergies   Patient has no known allergies.   Review of Systems Review  of Systems  HENT: Positive for dental problem.   All other systems reviewed and are negative.    Physical Exam Updated Vital Signs BP (!) 157/90 (BP Location: Right Arm)   Pulse 64   Temp 98.5 F (36.9 C) (Oral)   Resp 18   SpO2 99%   Physical Exam Vitals signs and nursing note reviewed.  HENT:     Head: Normocephalic.     Right Ear: Tympanic membrane normal.     Left Ear: Tympanic membrane normal.     Nose: Nose normal.     Mouth/Throat:     Mouth: Mucous membranes are moist.     Comments: Missing several teeth. Some gum swelling around L upper canine, no obvious periapical abscess but there is a cavity on that tooth. No cervical LAD, posterior pharynx clear  Eyes:     Extraocular Movements: Extraocular movements intact.     Pupils: Pupils are equal, round, and reactive to light.  Neck:     Musculoskeletal: Normal range of motion.  Cardiovascular:     Rate and Rhythm: Normal rate.  Pulmonary:     Effort: Pulmonary effort is normal.     Breath sounds: Normal breath sounds.  Abdominal:     General: Abdomen is flat.     Palpations: Abdomen is soft.  Musculoskeletal: Normal range of motion.  Skin:    General: Skin is warm.     Capillary Refill: Capillary refill takes less than 2 seconds.  Neurological:     General: No focal deficit present.     Mental Status: He is alert and oriented to person, place, and time.  Psychiatric:        Mood and Affect: Mood normal.      ED Treatments / Results  Labs (all labs ordered are listed, but only abnormal results are displayed) Labs Reviewed - No data to display  EKG None  Radiology No results found.  Procedures Procedures (including critical care time)  Medications Ordered in ED Medications  ibuprofen (ADVIL,MOTRIN) tablet 800 mg (has no administration in time range)     Initial Impression / Assessment and Plan / ED Course  I have reviewed the triage vital signs and the nursing notes.  Pertinent labs &  imaging results that were available during my care of the patient were reviewed by me and considered in my medical decision making (see chart for details).       Dennis Pope is a 33 y.o. male here with L upper gum pain after eating hot food. Likely inflammation vs early infection. No obvious abscess. He still has amoxicillin at home and just requesting motrin for pain. He has dental follow up    Final Clinical Impressions(s) / ED Diagnoses   Final diagnoses:  None    ED Discharge Orders    None       Drenda Freeze, MD 02/14/19 2149

## 2019-02-14 NOTE — ED Triage Notes (Signed)
Pt here for left sided dental pain.  Pt states hes been here previously and given abx and ibuprofen.  States all out of the 800 mg ibuprofen and needs more.  A&Ox4.

## 2019-02-15 ENCOUNTER — Emergency Department (HOSPITAL_COMMUNITY): Admission: EM | Admit: 2019-02-15 | Discharge: 2019-02-15 | Discharge status: Absent without leave | Payer: Self-pay

## 2019-02-15 ENCOUNTER — Emergency Department (HOSPITAL_COMMUNITY)
Admission: EM | Admit: 2019-02-15 | Discharge: 2019-02-15 | Disposition: A | Discharge status: Home / Self Care | Payer: Self-pay | Attending: Emergency Medicine | Admitting: Emergency Medicine

## 2019-02-15 ENCOUNTER — Encounter (HOSPITAL_COMMUNITY): Payer: Self-pay | Admitting: Emergency Medicine

## 2019-02-15 ENCOUNTER — Other Ambulatory Visit: Payer: Self-pay

## 2019-02-15 DIAGNOSIS — K047 Periapical abscess without sinus: Secondary | ICD-10-CM | POA: Insufficient documentation

## 2019-02-15 DIAGNOSIS — F17290 Nicotine dependence, other tobacco product, uncomplicated: Secondary | ICD-10-CM | POA: Insufficient documentation

## 2019-02-15 DIAGNOSIS — K029 Dental caries, unspecified: Secondary | ICD-10-CM | POA: Insufficient documentation

## 2019-02-15 MED ORDER — IBUPROFEN 800 MG PO TABS
800.0000 mg | ORAL_TABLET | Freq: Once | ORAL | Status: AC
  Administered 2019-02-15: 800 mg via ORAL
  Filled 2019-02-15: qty 1

## 2019-02-15 MED ORDER — BUPIVACAINE-EPINEPHRINE (PF) 0.5% -1:200000 IJ SOLN
1.8000 mL | Freq: Once | INTRAMUSCULAR | Status: AC
  Administered 2019-02-15: 1.8 mL
  Filled 2019-02-15: qty 1.8

## 2019-02-15 NOTE — Discharge Instructions (Signed)
Continue to take ibuprofen and the Penicillin you have at home. Follow up with the dentist as scheduled for in the morning.

## 2019-02-15 NOTE — ED Triage Notes (Signed)
Pt c/o L upper dental pain. Pt was seen in [facility] and given ibuprofen, pt returns for pain.

## 2019-02-15 NOTE — ED Provider Notes (Signed)
[facility] DEPT Provider Note   CSN: [id number] Arrival date & time: 02/15/19  1108    History   Chief Complaint Chief Complaint  Patient presents with  . Dental Pain    HPI Dennis Pope is a 33 y.o. male who presents to the ED with dental pain. Patient went to [facility] yesterday and was given ibuprofen. Patient here for pain management.      HPI  History reviewed. No pertinent past medical history.  There are no active problems to display for this patient.   History reviewed. No pertinent surgical history.      Home Medications    Prior to Admission medications   Medication Sig Start Date End Date Taking? Authorizing Provider  HYDROcodone-acetaminophen (NORCO) 5-325 MG tablet Take 1-2 tablets by mouth every 6 (six) hours as needed for moderate pain. 10/02/18   Robyn Haber, MD  ibuprofen (ADVIL,MOTRIN) 800 MG tablet Take 1 tablet (800 mg total) by mouth every 8 (eight) hours as needed. 02/14/19   Drenda Freeze, MD  penicillin v potassium (VEETID) 500 MG tablet Take 1 tablet (500 mg total) by mouth 3 (three) times daily. 11/05/18   Ward, Ozella Almond, PA-C    Family History Family History  Problem Relation Age of Onset  . Diabetes Mother   . Diabetes Father     Social History Social History   Tobacco Use  . Smoking status: Current Every Day Smoker    Packs/day: 0.20    Types: Cigars  . Smokeless tobacco: Never Used  Substance Use Topics  . Alcohol use: No  . Drug use: Yes    Frequency: 7.0 times per week    Types: Marijuana     Allergies   Patient has no known allergies.   Review of Systems Review of Systems  HENT: Positive for dental problem.   All other systems reviewed and are negative.    Physical Exam Updated Vital Signs BP (!) 161/106 (BP Location: Right Arm)   Pulse 63   Temp 98.3 F (36.8 C) (Oral)   Resp 16   SpO2 100%   Physical Exam Vitals signs and nursing note reviewed.    Constitutional:      Appearance: He is well-developed.  HENT:     Head: Normocephalic.     Right Ear: Tympanic membrane normal.     Left Ear: Tympanic membrane normal.     Nose: Nose normal.     Mouth/Throat:     Mouth: Mucous membranes are moist.     Dentition: Dental tenderness, gingival swelling and dental caries present.     Pharynx: Oropharynx is clear.   Neck:     Musculoskeletal: Neck supple.  Cardiovascular:     Rate and Rhythm: Normal rate.  Pulmonary:     Effort: Pulmonary effort is normal.  Musculoskeletal: Normal range of motion.  Lymphadenopathy:     Cervical: Cervical adenopathy present.  Skin:    General: Skin is warm and dry.  Neurological:     Mental Status: He is alert and oriented to person, place, and time.  Psychiatric:        Mood and Affect: Mood normal.      ED Treatments / Results  Labs (all labs ordered are listed, but only abnormal results are displayed) Labs Reviewed - No data to display  Radiology No results found.  Procedures Dental Block Date/Time: 02/15/2019 2:35 PM Performed by: Ashley Murrain, NP Authorized by: Ashley Murrain, NP  Consent:    Consent obtained:  Verbal   Consent given by:  Patient   Risks discussed:  Pain and unsuccessful block Indications:    Indications: dental pain   Location:    Block type:  Anterior superior alveolar   Laterality:  Left Procedure details (see MAR for exact dosages):    Syringe type:  Aspirating dental syringe   Needle gauge:  25 G   Anesthetic injected:  Bupivacaine 0.25% WITH epi   Injection procedure:  Anatomic landmarks identified, incremental injection and introduced needle Post-procedure details:    Outcome:  Pain relieved   Patient tolerance of procedure:  Tolerated with difficulty   (including critical care time)  Medications Ordered in ED Medications  ibuprofen (ADVIL,MOTRIN) tablet 800 mg (has no administration in time range)  bupivacaine-epinephrine (MARCAINE W/ EPI)  0.5% -1:200000 injection 1.8 mL (1.8 mLs Infiltration Given by Other 02/15/19 1415)     Initial Impression / Assessment and Plan / ED Course  I have reviewed the triage vital signs and the nursing notes. Patient with toothache.  No gross abscess.  Exam unconcerning for Ludwig's angina or spread of infection.  Will treat with penicillin and anti-inflammatories medicine.  Urged patient to follow-up with dentist.    Final Clinical Impressions(s) / ED Diagnoses   Final diagnoses:  Infected dental caries    ED Discharge Orders    None       Debroah Baller [place], [place] 02/15/19 [place]    Carmin Muskrat, MD 02/19/19 2152

## 2019-02-16 ENCOUNTER — Emergency Department (HOSPITAL_COMMUNITY)
Admission: EM | Admit: 2019-02-16 | Discharge: 2019-02-16 | Disposition: A | Discharge status: Home / Self Care | Payer: Self-pay | Attending: Emergency Medicine | Admitting: Emergency Medicine

## 2019-02-16 ENCOUNTER — Encounter (HOSPITAL_COMMUNITY): Payer: Self-pay | Admitting: Emergency Medicine

## 2019-02-16 ENCOUNTER — Other Ambulatory Visit: Payer: Self-pay

## 2019-02-16 DIAGNOSIS — F17290 Nicotine dependence, other tobacco product, uncomplicated: Secondary | ICD-10-CM | POA: Insufficient documentation

## 2019-02-16 DIAGNOSIS — Z79899 Other long term (current) drug therapy: Secondary | ICD-10-CM | POA: Insufficient documentation

## 2019-02-16 DIAGNOSIS — K0889 Other specified disorders of teeth and supporting structures: Secondary | ICD-10-CM | POA: Insufficient documentation

## 2019-02-16 MED ORDER — LIDOCAINE VISCOUS HCL 2 % MT SOLN: 15.0000 mL | OROMUCOSAL | 2 refills | Status: DC | PRN

## 2019-02-16 NOTE — ED Provider Notes (Signed)
[facility] EMERGENCY DEPARTMENT Provider Note   CSN: [id number] Arrival date & time: 02/16/19  0347    History   Chief Complaint Chief Complaint  Patient presents with  . Dental Pain    HPI Dennis Pope is a 33 y.o. male.     HPI   Dennis Pope is a 33 y.o. male, presenting to the ED with left upper dental pain starting around February 21. He has an appointment with a dentist at 2:30 PM today (2/24).  His pain has recurred from the dental block performed yesterday and is requesting another dental block.  Denies fever/chills, nausea/vomiting, facial swelling, difficulty breathing or swallowing, or any other complaints.    History reviewed. No pertinent past medical history.  There are no active problems to display for this patient.   History reviewed. No pertinent surgical history.      Home Medications    Prior to Admission medications   Medication Sig Start Date End Date Taking? Authorizing Provider  HYDROcodone-acetaminophen (NORCO) 5-325 MG tablet Take 1-2 tablets by mouth every 6 (six) hours as needed for moderate pain. 10/02/18   Robyn Haber, MD  ibuprofen (ADVIL,MOTRIN) 800 MG tablet Take 1 tablet (800 mg total) by mouth every 8 (eight) hours as needed. 02/14/19   Drenda Freeze, MD  lidocaine (XYLOCAINE) 2 % solution Use as directed 15 mLs in the mouth or throat as needed for mouth pain. 02/16/19   Joy, Shawn C, PA-C  penicillin v potassium (VEETID) 500 MG tablet Take 1 tablet (500 mg total) by mouth 3 (three) times daily. 11/05/18   Ward, Ozella Almond, PA-C    Family History Family History  Problem Relation Age of Onset  . Diabetes Mother   . Diabetes Father     Social History Social History   Tobacco Use  . Smoking status: Current Every Day Smoker    Packs/day: 0.20    Types: Cigars  . Smokeless tobacco: Never Used  Substance Use Topics  . Alcohol use: No  . Drug use: Yes    Frequency: 7.0 times per week   Types: Marijuana     Allergies   Patient has no known allergies.   Review of Systems Review of Systems  Constitutional: Negative for chills and fever.  HENT: Positive for dental problem. Negative for facial swelling.   Gastrointestinal: Negative for nausea and vomiting.  Musculoskeletal: Negative for neck pain and neck stiffness.     Physical Exam Updated Vital Signs BP (!) 165/100 (BP Location: Right Arm)   Pulse (!) 58   Temp 98.9 F (37.2 C) (Oral)   Resp (!) 21   SpO2 100%   Physical Exam Vitals signs and nursing note reviewed.  Constitutional:      General: He is not in acute distress.    Appearance: He is well-developed. He is not diaphoretic.  HENT:     Head: Normocephalic and atraumatic.     Mouth/Throat:     Comments: Tenderness to appears to be one of the left maxillary premolars.  Dentition appears to be intact and stable.  No noted area of swelling or fluctuance.  No trismus.  Mouth opening to at least 3 finger widths.  Handles oral secretions without difficulty.  No noted facial swelling.  No sublingual swelling.  No swelling or tenderness to the submental or submandibular regions.  No swelling or tenderness into the soft tissues of the neck. Eyes:     Conjunctiva/sclera: Conjunctivae normal.  Neck:  Musculoskeletal: Neck supple.  Cardiovascular:     Rate and Rhythm: Normal rate and regular rhythm.  Pulmonary:     Effort: Pulmonary effort is normal.  Lymphadenopathy:     Cervical: No cervical adenopathy.  Skin:    General: Skin is warm and dry.     Coloration: Skin is not pale.  Neurological:     Mental Status: He is alert.  Psychiatric:        Behavior: Behavior normal.      ED Treatments / Results  Labs (all labs ordered are listed, but only abnormal results are displayed) Labs Reviewed - No data to display  EKG None  Radiology No results found.  Procedures Dental Block Date/Time: 02/16/2019 4:42 AM Performed by: Lorayne Bender,  PA-C Authorized by: Lorayne Bender, PA-C   Consent:    Consent obtained:  Verbal   Consent given by:  Patient   Risks discussed:  Hematoma, pain, swelling and unsuccessful block Indications:    Indications: dental pain   Location:    Block type:  Middle superior alveolar   Laterality:  Left Procedure details (see MAR for exact dosages):    Topical anesthetic:  Benzocaine gel   Needle gauge:  27 G   Anesthetic injected:  Bupivacaine 0.5% WITH epi   Injection procedure:  Anatomic landmarks identified, anatomic landmarks palpated, introduced needle, negative aspiration for blood and incremental injection Post-procedure details:    Outcome:  Anesthesia achieved   Patient tolerance of procedure:  Tolerated well, no immediate complications   (including critical care time)  Medications Ordered in ED Medications - No data to display   Initial Impression / Assessment and Plan / ED Course  I have reviewed the triage vital signs and the nursing notes.  Pertinent labs & imaging results that were available during my care of the patient were reviewed by me and considered in my medical decision making (see chart for details).        Patient presents with left upper dental pain.  Low suspicion for Ludwig's angioedema or sepsis.  Patient has appropriate dental follow-up already scheduled. The patient was given instructions for home care as well as return precautions. Patient voices understanding of these instructions, accepts the plan, and is comfortable with discharge.  Final Clinical Impressions(s) / ED Diagnoses   Final diagnoses:  Pain, dental    ED Discharge Orders         Ordered    lidocaine (XYLOCAINE) 2 % solution  As needed     02/16/19 [place], Shawn C, PA-C 02/16/19 [place], Delice Bison, DO 02/16/19 [phone]

## 2019-02-16 NOTE — ED Triage Notes (Signed)
C/o L upper dental pain x 3 days.  Previous visit for same.

## 2019-02-16 NOTE — Discharge Instructions (Signed)
°  Dental Pain You have been seen today for dental pain. You should follow up with a dentist as soon as possible. This problem will not resolve on its own without the care of a dentist. Use ibuprofen or naproxen for pain. Use the viscous lidocaine for mouth pain. Swish with the lidocaine and spit it out. Do not swallow it. You should also swish with a homemade salt water solution, twice a day.  Make this solution by mixing 8 ounces of warm water with about half a teaspoon of salt. Antiinflammatory medications: Take 600 mg of ibuprofen every 6 hours or 440 mg (over the counter dose) to 500 mg (prescription dose) of naproxen every 12 hours for the next 3 days. After this time, these medications may be used as needed for pain. Take these medications with food to avoid upset stomach. Choose only one of these medications, do not take them together. Acetaminophen (generic for Tylenol): Should you continue to have additional pain while taking the ibuprofen or naproxen, you may add in acetaminophen as needed. Your daily total maximum amount of acetaminophen from all sources should be limited to 4000mg /day for persons without liver problems, or 2000mg /day for those with liver problems.  Please take all of your antibiotics until finished!   You may develop abdominal discomfort or diarrhea from the antibiotic.  You may help offset this with probiotics which you can buy or get in yogurt. Do not eat or take the probiotics until 2 hours after your antibiotic.   For prescription assistance, may try using prescription discount sites or apps, such as goodrx.com

## 2019-10-15 ENCOUNTER — Encounter (HOSPITAL_COMMUNITY): Payer: Self-pay | Admitting: Emergency Medicine

## 2019-10-15 ENCOUNTER — Emergency Department (HOSPITAL_COMMUNITY)
Admission: EM | Admit: 2019-10-15 | Discharge: 2019-10-15 | Disposition: A | Discharge status: Home / Self Care | Payer: Self-pay | Attending: Emergency Medicine | Admitting: Emergency Medicine

## 2019-10-15 ENCOUNTER — Other Ambulatory Visit: Payer: Self-pay

## 2019-10-15 DIAGNOSIS — F17290 Nicotine dependence, other tobacco product, uncomplicated: Secondary | ICD-10-CM | POA: Insufficient documentation

## 2019-10-15 DIAGNOSIS — K0889 Other specified disorders of teeth and supporting structures: Secondary | ICD-10-CM | POA: Insufficient documentation

## 2019-10-15 MED ORDER — PENICILLIN V POTASSIUM 500 MG PO TABS: 500.0000 mg | ORAL_TABLET | Freq: Four times a day (QID) | ORAL | 0 refills | Status: AC

## 2019-10-15 MED ORDER — BUPIVACAINE-EPINEPHRINE (PF) 0.5% -1:200000 IJ SOLN
1.8000 mL | Freq: Once | INTRAMUSCULAR | Status: AC
  Administered 2019-10-15: 1.8 mL

## 2019-10-15 MED ORDER — NAPROXEN 500 MG PO TABS: 500.0000 mg | ORAL_TABLET | Freq: Two times a day (BID) | ORAL | 0 refills | Status: DC

## 2019-10-15 NOTE — Discharge Instructions (Addendum)
Please read instructions below. °Take the antibiotic, Penicillin V, 4 times per day until they are gone. °You can take Naproxen up to 2 times per day with meals, as needed for pain. °Schedule an appointment with a dentist, using the dental resource guide attached. °Return to the ER for difficulty swallowing or breathing, fever, or new or worsening symptoms. ° °

## 2019-10-15 NOTE — ED Triage Notes (Signed)
Pt here for dental pain. Pt reports same has been going on for 2 days now.

## 2019-10-15 NOTE — ED Provider Notes (Signed)
[facility] EMERGENCY DEPARTMENT Provider Note   CSN: [id number] Arrival date & time: 10/15/19  1423     History   Chief Complaint Chief Complaint  Patient presents with  . Dental Pain    HPI Dennis Pope is a 33 y.o. male presenting to the emergency department with 2 days of left upper dental pain.  Pain worsened today and is temporarily relieved with Advil and Tylenol.  Denies swelling or fever, no difficulty breathing or swallowing.  He states he has had issues with this tooth in the past though does not have a dentist.    The history is provided by the patient.    History reviewed. No pertinent past medical history.  There are no active problems to display for this patient.   History reviewed. No pertinent surgical history.      Home Medications    Prior to Admission medications   Medication Sig Start Date End Date Taking? Authorizing Provider  HYDROcodone-acetaminophen (NORCO) 5-325 MG tablet Take 1-2 tablets by mouth every 6 (six) hours as needed for moderate pain. 10/02/18   Robyn Haber, MD  ibuprofen (ADVIL,MOTRIN) 800 MG tablet Take 1 tablet (800 mg total) by mouth every 8 (eight) hours as needed. 02/14/19   Drenda Freeze, MD  lidocaine (XYLOCAINE) 2 % solution Use as directed 15 mLs in the mouth or throat as needed for mouth pain. 02/16/19   Joy, Shawn C, PA-C  naproxen (NAPROSYN) 500 MG tablet Take 1 tablet (500 mg total) by mouth 2 (two) times daily with a meal. 10/15/19   Aparna Vanderweele, [place] N, PA-C  penicillin v potassium (VEETID) 500 MG tablet Take 1 tablet (500 mg total) by mouth 4 (four) times daily for 7 days. 10/15/19 10/22/19  Hannan Tetzlaff, [place] N, PA-C    Family History Family History  Problem Relation Age of Onset  . Diabetes Mother   . Diabetes Father     Social History Social History   Tobacco Use  . Smoking status: Current Every Day Smoker    Packs/day: 0.20    Types: Cigars  . Smokeless tobacco: Never Used   Substance Use Topics  . Alcohol use: No  . Drug use: Yes    Frequency: 7.0 times per week    Types: Marijuana     Allergies   Patient has no known allergies.   Review of Systems Review of Systems  Constitutional: Negative for fever.  HENT: Positive for dental problem.      Physical Exam Updated Vital Signs BP (!) 140/111 (BP Location: Left Arm)   Pulse (!) 58   Temp 98.1 F (36.7 C) (Oral)   Resp 16   Ht 5\' 10"  (1.778 m)   Wt 70.3 kg   SpO2 97%   BMI 22.24 kg/m   Physical Exam Vitals signs and nursing note reviewed.  Constitutional:      General: He is not in acute distress.    Appearance: He is well-developed.  HENT:     Head: Normocephalic and atraumatic.     Mouth/Throat:      Comments: 1st left upper molar with TTP. No obvious decay, though may have small carie between 1st and 2nd molar. No gingival erythema, tenderness or fluctuance. Uvula midline, tolerating secretions. No LAD. Eyes:     Conjunctiva/sclera: Conjunctivae normal.  Cardiovascular:     Rate and Rhythm: Normal rate.  Pulmonary:     Effort: Pulmonary effort is normal.  Neurological:     Mental Status:  He is alert.  Psychiatric:        Mood and Affect: Mood normal.        Behavior: Behavior normal.      ED Treatments / Results  Labs (all labs ordered are listed, but only abnormal results are displayed) Labs Reviewed - No data to display  EKG None  Radiology No results found.  Procedures Dental Block  Date/Time: 10/15/2019 6:24 PM Performed by: Bettye Sitton, [place] N, PA-C Authorized by: Nilsa Macht, [place] N, PA-C   Consent:    Consent obtained:  Verbal   Consent given by:  Patient   Risks discussed:  Pain, unsuccessful block and nerve damage   Alternatives discussed:  No treatment Indications:    Indications: dental pain   Location:    Block type:  Middle superior alveolar   Laterality:  Left Procedure details (see MAR for exact dosages):    Syringe type:  Controlled  syringe   Needle gauge:  27 G   Anesthetic injected:  Bupivacaine 0.5% WITH epi   Injection procedure:  Anatomic landmarks identified Post-procedure details:    Outcome:  Pain relieved   Patient tolerance of procedure:  Tolerated well, no immediate complications   (including critical care time)  Medications Ordered in ED Medications  bupivacaine-epinephrine (MARCAINE W/ EPI) 0.5% -1:200000 injection 1.8 mL (1.8 mLs Infiltration Given 10/15/19 1826)     Initial Impression / Assessment and Plan / ED Course  I have reviewed the triage vital signs and the nursing notes.  Pertinent labs & imaging results that were available during my care of the patient were reviewed by me and considered in my medical decision making (see chart for details).       Patient with dental pain, likely due to dental carie.  No gross abscess.  VSS, afebrile, tolerating secretions. Exam unconcerning for peritonsillar abscess, Ludwig's angina or spread of infection.  Patient requested dental block, which is administered with significant provement in symptoms.  Will treat with penicillin and pain medicine.  Urged patient to follow-up with dentist. Pt safe for discharge.  Discussed results, findings, treatment and follow up. Patient advised of return precautions. Patient verbalized understanding and agreed with plan.  Final Clinical Impressions(s) / ED Diagnoses   Final diagnoses:  Pain, dental    ED Discharge Orders         Ordered    naproxen (NAPROSYN) 500 MG tablet  2 times daily with meals     10/15/19 1828    penicillin v potassium (VEETID) 500 MG tablet  4 times daily     10/15/19 1828           Larwence Tu, [place] N, PA-C 10/15/19 1833    Varney Biles, MD 10/16/19 [phone]

## 2019-10-15 NOTE — ED Notes (Signed)
Patient verbalizes understanding of discharge instructions. Opportunity for questioning and answers were provided. Armband removed by staff, pt discharged from ED ambulatory.   

## 2019-10-15 NOTE — ED Notes (Signed)
Delay explained

## 2019-10-16 ENCOUNTER — Emergency Department (HOSPITAL_COMMUNITY)
Admission: EM | Admit: 2019-10-16 | Discharge: 2019-10-16 | Disposition: A | Discharge status: Home / Self Care | Payer: Self-pay | Attending: Emergency Medicine | Admitting: Emergency Medicine

## 2019-10-16 ENCOUNTER — Other Ambulatory Visit: Payer: Self-pay

## 2019-10-16 ENCOUNTER — Encounter (HOSPITAL_COMMUNITY): Payer: Self-pay | Admitting: Emergency Medicine

## 2019-10-16 DIAGNOSIS — Z79899 Other long term (current) drug therapy: Secondary | ICD-10-CM | POA: Insufficient documentation

## 2019-10-16 DIAGNOSIS — K0889 Other specified disorders of teeth and supporting structures: Secondary | ICD-10-CM | POA: Insufficient documentation

## 2019-10-16 DIAGNOSIS — F17210 Nicotine dependence, cigarettes, uncomplicated: Secondary | ICD-10-CM | POA: Insufficient documentation

## 2019-10-16 DIAGNOSIS — I10 Essential (primary) hypertension: Secondary | ICD-10-CM | POA: Insufficient documentation

## 2019-10-16 MED ORDER — BUPIVACAINE-EPINEPHRINE (PF) 0.5% -1:200000 IJ SOLN
1.8000 mL | Freq: Once | INTRAMUSCULAR | Status: AC
Start: 2019-10-16 — End: 2019-10-16
  Administered 2019-10-16: 1.8 mL
  Filled 2019-10-16 (×2): qty 1.8

## 2019-10-16 NOTE — ED Provider Notes (Signed)
[facility] EMERGENCY DEPARTMENT Provider Note   CSN: [id number] Arrival date & time: 10/16/19  [id number]     History   Chief Complaint Chief Complaint  Patient presents with  . Dental Pain    HPI Dennis Pope is a 33 y.o. male with a hx of no major medical problems presents to the Emergency Department complaining of gradual, persistent, progressively worsening left upper dental pain onset 2 days ago.  Patient reports he has been taking Advil and Tylenol with temporary relief.  He denies facial swelling, fever, shortness of breath, difficulty swallowing, changes in voice.  He reports he has had issues with this tooth in the past but does not currently see a dentist.  He reports it is his intention to have the tooth removed.  Eating and drinking make his symptoms worse.  Patient reports he was evaluated yesterday afternoon in the emergency department for the same.  He reports he did have a dental block at that time which improved his pain.  He also reports that his pain has improved since the antibiotics were given however he request additional dental block in an effort to get some sleep as he was unable to sleep last night due to pain.     The history is provided by the patient and medical records. No language interpreter was used.    History reviewed. No pertinent past medical history.  There are no active problems to display for this patient.   History reviewed. No pertinent surgical history.      Home Medications    Prior to Admission medications   Medication Sig Start Date End Date Taking? Authorizing Provider  HYDROcodone-acetaminophen (NORCO) 5-325 MG tablet Take 1-2 tablets by mouth every 6 (six) hours as needed for moderate pain. 10/02/18   Robyn Haber, MD  ibuprofen (ADVIL,MOTRIN) 800 MG tablet Take 1 tablet (800 mg total) by mouth every 8 (eight) hours as needed. 02/14/19   Drenda Freeze, MD  lidocaine (XYLOCAINE) 2 % solution Use as  directed 15 mLs in the mouth or throat as needed for mouth pain. 02/16/19   Joy, Shawn C, PA-C  naproxen (NAPROSYN) 500 MG tablet Take 1 tablet (500 mg total) by mouth 2 (two) times daily with a meal. 10/15/19   Robinson, [place] N, PA-C  penicillin v potassium (VEETID) 500 MG tablet Take 1 tablet (500 mg total) by mouth 4 (four) times daily for 7 days. 10/15/19 10/22/19  Robinson, [place] N, PA-C    Family History Family History  Problem Relation Age of Onset  . Diabetes Mother   . Diabetes Father     Social History Social History   Tobacco Use  . Smoking status: Current Every Day Smoker    Packs/day: 0.20    Types: Cigars  . Smokeless tobacco: Never Used  Substance Use Topics  . Alcohol use: No  . Drug use: Yes    Frequency: 7.0 times per week    Types: Marijuana     Allergies   Patient has no known allergies.   Review of Systems Review of Systems  Constitutional: Negative for chills and fever.  HENT: Positive for dental problem. Negative for drooling, ear pain, facial swelling and trouble swallowing.   Eyes: Negative for visual disturbance.  Gastrointestinal: Negative for nausea and vomiting.  Musculoskeletal: Negative for neck pain and neck stiffness.  Neurological: Negative for headaches.  All other systems reviewed and are negative.    Physical Exam Updated Vital Signs BP Marland Kitchen)  146/94 (BP Location: Left Arm)   Pulse 65   Temp 98.3 F (36.8 C) (Oral)   Resp 16   SpO2 100%   Physical Exam Vitals signs and nursing note reviewed.  Constitutional:      Appearance: He is well-developed.  HENT:     Head: Normocephalic.     Right Ear: External ear normal.     Left Ear: External ear normal.     Nose: Nose normal.     Mouth/Throat:     Mouth: Mucous membranes are moist. No lacerations or oral lesions.     Dentition: Dental tenderness present.     Pharynx: Uvula midline. No oropharyngeal exudate, posterior oropharyngeal erythema or uvula swelling.     Tonsils:  No tonsillar abscesses.   Eyes:     General:        Right eye: No discharge.        Left eye: No discharge.     Conjunctiva/sclera: Conjunctivae normal.  Neck:     Musculoskeletal: Normal range of motion and neck supple.     Comments: No stridor Handling secretions without difficulty No nuchal rigidity Cardiovascular:     Rate and Rhythm: Normal rate and regular rhythm.  Pulmonary:     Effort: Pulmonary effort is normal. No respiratory distress.  Skin:    General: Skin is warm and dry.  Neurological:     Mental Status: He is alert.  Psychiatric:        Mood and Affect: Mood normal.      ED Treatments / Results    Procedures Dental Block  Date/Time: 10/16/2019 6:35 AM Performed by: Abigail Butts, PA-C Authorized by: Abigail Butts, PA-C   Consent:    Consent obtained:  Verbal   Consent given by:  Patient   Risks discussed:  Allergic reaction, infection, nerve damage, swelling, hematoma, intravascular injection, pain and unsuccessful block   Alternatives discussed:  No treatment Indications:    Indications: dental pain   Location:    Block type:  Supraperiosteal   Supraperiosteal location:  Upper teeth   Upper teeth location:  12/LU 1st bicuspid Procedure details (see MAR for exact dosages):    Syringe type:  Luer lock syringe   Needle gauge:  27 G   Anesthetic injected:  Bupivacaine 0.5% WITH epi   Injection procedure:  Anatomic landmarks identified, introduced needle, incremental injection, anatomic landmarks palpated and negative aspiration for blood Post-procedure details:    Outcome:  Anesthesia achieved   Patient tolerance of procedure:  Tolerated well, no immediate complications   (including critical care time)  Medications Ordered in ED Medications  bupivacaine-epinephrine (MARCAINE W/ EPI) 0.5% -1:200000 injection 1.8 mL (1.8 mLs Infiltration Given 10/16/19 [id number])     Initial Impression / Assessment and Plan / ED Course  I have  reviewed the triage vital signs and the nursing notes.  Pertinent labs & imaging results that were available during my care of the patient were reviewed by me and considered in my medical decision making (see chart for details).  Clinical Course as of Oct 15 642  Fri Oct 16, 2019  [id number] HTN noted.  Pt informed he should have recheck at PCP  BP(!): 145/98 [HM]    Clinical Course User Index [HM] Oza Oberle, Jarrett Soho, PA-C       Patient with toothache.  No gross abscess.  Exam unconcerning for Ludwig's angina or spread of infection.  Patient given antibiotics at his visit yesterday.  Dental block provided again today for  pain relief.  Patient with complete resolution of pain after block.  Urged patient to follow-up with dentist.    Patient noted to be hypertensive.  No chest pain or shortness of breath.  Likely secondary to pain.  He was hypertensive yesterday as well.  He will follow with primary care for repeat blood pressures.   Final Clinical Impressions(s) / ED Diagnoses   Final diagnoses:  Pain, dental  Hypertension, unspecified type    ED Discharge Orders    None       Agapito Games 10/16/19 [id number]    Ripley Fraise, MD 10/16/19 [phone]

## 2019-10-16 NOTE — ED Notes (Signed)
ED Provider at bedside. 

## 2019-10-16 NOTE — ED Triage Notes (Signed)
Patient here with dental pain.  States he wants his mouth numb so that the pain is gone and he can get some sleep.

## 2019-10-16 NOTE — Discharge Instructions (Addendum)
1. Medications: Prescriptions written yesterday, usual home medications 2. Treatment: rest, drink plenty of fluids, take medications as prescribed 3. Follow Up: Please followup with dentistry within 1 week for discussion of your diagnoses and further evaluation after today's visit; if you do not have a primary care doctor use the resource guide provided to find one; Return to the ER for high fevers, difficulty breathing, difficulty swallowing or other concerning symptoms

## 2020-07-02 ENCOUNTER — Emergency Department (HOSPITAL_COMMUNITY)
Admission: EM | Admit: 2020-07-02 | Discharge: 2020-07-03 | Disposition: A | Discharge status: Home / Self Care | Payer: Self-pay | Attending: Emergency Medicine | Admitting: Emergency Medicine

## 2020-07-02 ENCOUNTER — Other Ambulatory Visit: Payer: Self-pay

## 2020-07-02 ENCOUNTER — Encounter (HOSPITAL_COMMUNITY): Payer: Self-pay | Admitting: *Deleted

## 2020-07-02 DIAGNOSIS — L0291 Cutaneous abscess, unspecified: Secondary | ICD-10-CM

## 2020-07-02 DIAGNOSIS — L02214 Cutaneous abscess of groin: Secondary | ICD-10-CM | POA: Insufficient documentation

## 2020-07-02 DIAGNOSIS — F17290 Nicotine dependence, other tobacco product, uncomplicated: Secondary | ICD-10-CM | POA: Insufficient documentation

## 2020-07-02 NOTE — ED Triage Notes (Signed)
Rt groin iabscess in his rt groin for 3-4 days  Hx of the same

## 2020-07-03 MED ORDER — LIDOCAINE HCL (PF) 1 % IJ SOLN
10.0000 mL | Freq: Once | INTRAMUSCULAR | Status: AC
  Administered 2020-07-03: 10 mL
  Filled 2020-07-03: qty 10

## 2020-07-03 MED ORDER — DOXYCYCLINE HYCLATE 100 MG PO CAPS
100.0000 mg | ORAL_CAPSULE | Freq: Two times a day (BID) | ORAL | 0 refills | Status: AC
Start: 2020-07-03 — End: 2020-07-10

## 2020-07-03 NOTE — ED Notes (Signed)
Dressing placed on incision. Discharge instructions reviewed with pt. Pt verbalized understanding.

## 2020-07-03 NOTE — ED Provider Notes (Signed)
[facility] EMERGENCY DEPARTMENT Provider Note   CSN: [id number] Arrival date & time: 07/02/20  1726     History Chief Complaint  Patient presents with  . Abscess    Dennis Pope is a 34 y.o. male who presents to the ED today with complaint of pain, swelling, and redness to his R groin x 2-3 days. Pt reports that while in the waiting room the abscess began to drain. He has had issues with abscesses in this area in the past - reports last time was 2019 when he had it drained in the ED. He has been taking OTC meds without relief. Denies fevers, chills, penile pain, testicular swelling,pain, penile discharge, or any other associated symptoms.   The history is provided by the patient and medical records.       History reviewed. No pertinent past medical history.  There are no problems to display for this patient.   History reviewed. No pertinent surgical history.     Family History  Problem Relation Age of Onset  . Diabetes Mother   . Diabetes Father     Social History   Tobacco Use  . Smoking status: Current Every Day Smoker    Packs/day: 0.20    Types: Cigars  . Smokeless tobacco: Never Used  Vaping Use  . Vaping Use: Never used  Substance Use Topics  . Alcohol use: No  . Drug use: Yes    Frequency: 7.0 times per week    Types: Marijuana    Home Medications Prior to Admission medications   Medication Sig Start Date End Date Taking? Authorizing Provider  doxycycline (VIBRAMYCIN) 100 MG capsule Take 1 capsule (100 mg total) by mouth 2 (two) times daily for 7 days. 07/03/20 07/10/20  Eustaquio Maize, PA-C  HYDROcodone-acetaminophen (NORCO) 5-325 MG tablet Take 1-2 tablets by mouth every 6 (six) hours as needed for moderate pain. Patient not taking: Reported on 07/03/2020 10/02/18   Robyn Haber, MD  ibuprofen (ADVIL,MOTRIN) 800 MG tablet Take 1 tablet (800 mg total) by mouth every 8 (eight) hours as needed. Patient not taking: Reported on  07/03/2020 02/14/19   Drenda Freeze, MD  lidocaine (XYLOCAINE) 2 % solution Use as directed 15 mLs in the mouth or throat as needed for mouth pain. Patient not taking: Reported on 07/03/2020 02/16/19   Arlean Hopping C, PA-C  naproxen (NAPROSYN) 500 MG tablet Take 1 tablet (500 mg total) by mouth 2 (two) times daily with a meal. Patient not taking: Reported on 07/03/2020 10/15/19   Robinson, [place] N, PA-C    Allergies    Patient has no known allergies.  Review of Systems   Review of Systems  Constitutional: Negative for chills and fever.  Skin:       + abscess to R inguinal fold  All other systems reviewed and are negative.   Physical Exam Updated Vital Signs BP (!) 130/102 (BP Location: Left Arm)   Pulse 72   Temp 98.5 F (36.9 C) (Oral)   Resp 16   Ht 5\' 10"  (1.778 m)   Wt 66.2 kg   SpO2 100%   BMI 20.94 kg/m   Physical Exam Vitals and nursing note reviewed.  Constitutional:      Appearance: He is not ill-appearing.  HENT:     Head: Normocephalic and atraumatic.  Eyes:     Conjunctiva/sclera: Conjunctivae normal.  Cardiovascular:     Rate and Rhythm: Normal rate and regular rhythm.  Pulmonary:  Effort: Pulmonary effort is normal.     Breath sounds: Normal breath sounds.  Abdominal:     Palpations: Abdomen is soft.     Tenderness: There is no abdominal tenderness.  Genitourinary:    Comments: Chaperone present for exam. 3 x 1 cm are of induration with small pinpoint area of purulent drainage noted to R inguinal fold. Does not involved the scrotum.  Musculoskeletal:     Cervical back: Neck supple.  Skin:    General: Skin is warm and dry.  Neurological:     Mental Status: He is alert.     ED Results / Procedures / Treatments   Labs (all labs ordered are listed, but only abnormal results are displayed) Labs Reviewed - No data to display  EKG None  Radiology No results found.  Procedures .Marland KitchenIncision and Drainage  Date/Time: 07/03/2020 3:30  AM Performed by: Eustaquio Maize, PA-C Authorized by: Eustaquio Maize, PA-C   Consent:    Consent obtained:  Verbal   Consent given by:  Patient   Risks discussed:  Bleeding, incomplete drainage, infection and pain Location:    Type:  Abscess   Size:  3 x 1   Location:  Anogenital   Anogenital location: Inguinal fold. Pre-procedure details:    Skin preparation:  Betadine Anesthesia (see MAR for exact dosages):    Anesthesia method:  Local infiltration   Local anesthetic:  Lidocaine 1% w/o epi Procedure details:    Incision types:  Stab incision   Scalpel blade:  11   Wound management:  Probed and deloculated   Drainage:  Bloody   Drainage amount:  Scant   Wound treatment:  Wound left open Post-procedure details:    Patient tolerance of procedure:  Procedure terminated at patient's request   (including critical care time)  Medications Ordered in ED Medications  lidocaine (PF) (XYLOCAINE) 1 % injection 10 mL (10 mLs Infiltration Given by Other 07/03/20 1610)    ED Course  I have reviewed the triage vital signs and the nursing notes.  Pertinent labs & imaging results that were available during my care of the patient were reviewed by me and considered in my medical decision making (see chart for details).    MDM Rules/Calculators/A&P                          34 year old male who presents to the ED today with abscess to right inguinal fold that began a few days ago, started draining while in the ED today.  History of same.  On arrival to the ED patient is afebrile, nontachycardic and nontachypneic.  He appears to be in no acute distress.  Chaperone present for exam.  3 x 1 cm area of induration with small pinpoint area that is draining to right inguinal fold.  It does not involve the scrotum.  No testicular swelling or pain.  Per chart review patient had I&D performed in 2019.  Will do same today.  Patient will need to follow-up with [facility] given recurrent  abscesses.   I&D terminated at patient's request.  I was able to perform the incision however he did not tolerate the bander of the procedure.  Small amount of bloody material excreted.  Likely the abscess drained as much as it could prior to I&D.  Will discharge patient home with doxycycline.  He is instructed to return to the ED in 48 hours for recheck.   This note was prepared using  [employer] training and education officer and may include unintentional dictation errors due to the inherent limitations of voice recognition software.  Final Clinical Impression(s) / ED Diagnoses Final diagnoses:  Abscess    Rx / DC Orders ED Discharge Orders         Ordered    doxycycline (VIBRAMYCIN) 100 MG capsule  2 times daily     Discontinue  Reprint     07/03/20 0332           Discharge Instructions     The procedure was tolerated at your request Please pick up antibiotics and take as prescribed While at home please place warm compresses to the area. I would also recommend sitz baths to help soften the pus Return to the ED in 48 hours for wound recheck You can also follow up with [facility] given recurrent abscesses       Eustaquio Maize, PA-C 07/03/20 1117    Veryl Speak, MD 07/03/20 [phone]

## 2020-07-03 NOTE — Discharge Instructions (Addendum)
The procedure was tolerated at your request Please pick up antibiotics and take as prescribed While at home please place warm compresses to the area. I would also recommend sitz baths to help soften the pus Return to the ED in 48 hours for wound recheck You can also follow up with [facility] given recurrent abscesses

## 2022-02-02 ENCOUNTER — Other Ambulatory Visit: Payer: Self-pay

## 2022-02-02 ENCOUNTER — Emergency Department (HOSPITAL_COMMUNITY)
Admission: EM | Admit: 2022-02-02 | Discharge: 2022-02-02 | Disposition: A | Discharge status: Home / Self Care | Payer: Self-pay | Attending: Emergency Medicine | Admitting: Emergency Medicine

## 2022-02-02 DIAGNOSIS — S00412A Abrasion of left ear, initial encounter: Secondary | ICD-10-CM | POA: Insufficient documentation

## 2022-02-02 DIAGNOSIS — W228XXA Striking against or struck by other objects, initial encounter: Secondary | ICD-10-CM | POA: Insufficient documentation

## 2022-02-02 MED ORDER — TETANUS-DIPHTH-ACELL PERTUSSIS 5-2.5-18.5 LF-MCG/0.5 IM SUSY
0.5000 mL | PREFILLED_SYRINGE | Freq: Once | INTRAMUSCULAR | Status: DC
  Filled 2022-02-02: qty 0.5

## 2022-02-02 NOTE — ED Provider Notes (Signed)
[facility] EMERGENCY DEPARTMENT Provider Note   CSN: [id number] Arrival date & time: 02/02/22  1913     History  No chief complaint on file.   Dennis Pope is a 36 y.o. male.  36 year old male presents with complaint of laceration to the left ear when he was struck in the head with something today while moving something.  No other injuries, no other specifics provided.  No other complaints or concerns.  Last tetanus unknown.      Home Medications Prior to Admission medications   Medication Sig Start Date End Date Taking? Authorizing Provider  HYDROcodone-acetaminophen (NORCO) 5-325 MG tablet Take 1-2 tablets by mouth every 6 (six) hours as needed for moderate pain. Patient not taking: Reported on 07/03/2020 10/02/18   Robyn Haber, MD  ibuprofen (ADVIL,MOTRIN) 800 MG tablet Take 1 tablet (800 mg total) by mouth every 8 (eight) hours as needed. Patient not taking: Reported on 07/03/2020 02/14/19   Drenda Freeze, MD  lidocaine (XYLOCAINE) 2 % solution Use as directed 15 mLs in the mouth or throat as needed for mouth pain. Patient not taking: Reported on 07/03/2020 02/16/19   Arlean Hopping C, PA-C  naproxen (NAPROSYN) 500 MG tablet Take 1 tablet (500 mg total) by mouth 2 (two) times daily with a meal. Patient not taking: Reported on 07/03/2020 10/15/19   Robinson, [place] N, PA-C      Allergies    Patient has no known allergies.    Review of Systems   Review of Systems Negative except as per HPI Physical Exam Updated Vital Signs BP (!) 150/104    Pulse 88    Temp 98.4 F (36.9 C) (Oral)    Resp 16    SpO2 98%  Physical Exam Vitals and nursing note reviewed.  Constitutional:      General: He is not in acute distress.    Appearance: He is well-developed. He is not diaphoretic.  HENT:     Head: Normocephalic and atraumatic.      Comments: Minor abrasion to left ear just above the pinna, does not require closure.    Left Ear: Tympanic membrane, ear  canal and external ear normal.  Pulmonary:     Effort: Pulmonary effort is normal.  Musculoskeletal:        General: Normal range of motion.     Cervical back: Normal range of motion and neck supple. No tenderness.  Skin:    General: Skin is warm and dry.     Findings: No erythema or rash.  Neurological:     Mental Status: He is alert and oriented to person, place, and time.  Psychiatric:        Behavior: Behavior normal.    ED Results / Procedures / Treatments   Labs (all labs ordered are listed, but only abnormal results are displayed) Labs Reviewed - No data to display  EKG None  Radiology No results found.  Procedures Procedures    Medications Ordered in ED Medications  Tdap (BOOSTRIX) injection 0.5 mL (has no administration in time range)    ED Course/ Medical Decision Making/ A&P                           Medical Decision Making Risk Prescription drug management.   36 year old male with laceration to left ear not requiring closure, more superficial or like an abrasion in nature.  No active bleeding.  Tetanus will be updated.  Recommend  home wound care including topical antibiotic ointment as needed.        Final Clinical Impression(s) / ED Diagnoses Final diagnoses:  Abrasion of left ear, initial encounter    Rx / DC Orders ED Discharge Orders     None         Roque Lias 02/02/22 Bevelyn Buckles, MD 02/03/22 1515

## 2022-02-02 NOTE — Discharge Instructions (Signed)
Clean wound with mild soap and water.  Apply bacitracin antibiotic ointment as needed.

## 2022-02-02 NOTE — ED Triage Notes (Signed)
Pt reported to ED for evaluation of left ear after stating he was struck by object while moving items today. Pt states his ear was bleeding. Upon assessment, small laceration above ear noted, ear drum appears intact. Pt denies any changes to hearing or other injuries at this time.

## 2023-01-25 ENCOUNTER — Encounter (HOSPITAL_COMMUNITY): Payer: Self-pay

## 2023-01-25 ENCOUNTER — Ambulatory Visit (HOSPITAL_COMMUNITY)
Admission: EM | Admit: 2023-01-25 | Discharge: 2023-01-25 | Disposition: A | Discharge status: Home / Self Care | Payer: Self-pay | Attending: Emergency Medicine | Admitting: Emergency Medicine

## 2023-01-25 DIAGNOSIS — K047 Periapical abscess without sinus: Secondary | ICD-10-CM

## 2023-01-25 DIAGNOSIS — H00015 Hordeolum externum left lower eyelid: Secondary | ICD-10-CM

## 2023-01-25 DIAGNOSIS — R220 Localized swelling, mass and lump, head: Secondary | ICD-10-CM

## 2023-01-25 MED ORDER — NAPROXEN 500 MG PO TABS: 500.0000 mg | ORAL_TABLET | Freq: Two times a day (BID) | ORAL | 0 refills | Status: DC

## 2023-01-25 MED ORDER — AMOXICILLIN-POT CLAVULANATE 875-125 MG PO TABS: 1.0000 | ORAL_TABLET | Freq: Two times a day (BID) | ORAL | 0 refills | Status: DC

## 2023-01-25 NOTE — ED Provider Notes (Signed)
[place]    CSN: [id number] Arrival date & time: 01/25/23  1052      History   Chief Complaint Chief Complaint  Patient presents with   Facial Swelling    HPI Dennis Pope is a 37 y.o. male.   Patient presents today with left-sided facial swelling near the jawline and also a stye underneath his left eye for the past 3 days now.  Patient denies any dental pain but has several tooth abscesses in the past.  Denies any known fevers no visual difficulties.    History reviewed. No pertinent past medical history.  There are no problems to display for this patient.   Past Surgical History:  Procedure Laterality Date   DENTAL SURGERY         Home Medications    Prior to Admission medications   Medication Sig Start Date End Date Taking? Authorizing Provider  amoxicillin-clavulanate (AUGMENTIN) 875-125 MG tablet Take 1 tablet by mouth every 12 (twelve) hours. 01/25/23  Yes Marney Setting, NP  HYDROcodone-acetaminophen (NORCO) 5-325 MG tablet Take 1-2 tablets by mouth every 6 (six) hours as needed for moderate pain. Patient not taking: Reported on 07/03/2020 10/02/18   Robyn Haber, MD  ibuprofen (ADVIL,MOTRIN) 800 MG tablet Take 1 tablet (800 mg total) by mouth every 8 (eight) hours as needed. Patient not taking: Reported on 07/03/2020 02/14/19   Drenda Freeze, MD  lidocaine (XYLOCAINE) 2 % solution Use as directed 15 mLs in the mouth or throat as needed for mouth pain. Patient not taking: Reported on 07/03/2020 02/16/19   Arlean Hopping C, PA-C  naproxen (NAPROSYN) 500 MG tablet Take 1 tablet (500 mg total) by mouth 2 (two) times daily with a meal. 01/25/23   Marney Setting, NP    Family History Family History  Problem Relation Age of Onset   Diabetes Mother    Diabetes Father     Social History Social History   Tobacco Use   Smoking status: Every Day    Types: Cigars   Smokeless tobacco: Never  Vaping Use   Vaping Use: Never used   Substance Use Topics   Alcohol use: No   Drug use: Yes    Frequency: 7.0 times per week    Types: Marijuana     Allergies   Patient has no known allergies.   Review of Systems Review of Systems  Constitutional:  Negative for chills and fever.  HENT:  Positive for dental problem and facial swelling. Negative for ear pain.   Eyes:  Negative for pain.       A knot static appearance to the lower left lower lid  Respiratory: Negative.    Cardiovascular: Negative.   Skin:  Negative for wound.  Neurological: Negative.      Physical Exam Triage Vital Signs ED Triage Vitals  Enc Vitals Group     BP 01/25/23 1311 135/88     Pulse Rate 01/25/23 1311 77     Resp 01/25/23 1311 16     Temp 01/25/23 1311 98.1 F (36.7 C)     Temp Source 01/25/23 1311 Oral     SpO2 01/25/23 1311 96 %     Weight --      Height --      Head Circumference --      Peak Flow --      Pain Score 01/25/23 1313 8     Pain Loc --      Pain Edu? --  Excl. in GC? --    No data found.  Updated Vital Signs BP 135/88 (BP Location: Left Arm)   Pulse 77   Temp 98.1 F (36.7 C) (Oral)   Resp 16   SpO2 96%   Visual Acuity Right Eye Distance:   Left Eye Distance:   Bilateral Distance:    Right Eye Near:   Left Eye Near:    Bilateral Near:     Physical Exam Constitutional:      Appearance: Normal appearance.  HENT:     Mouth/Throat:     Dentition: Dental tenderness, gingival swelling, dental caries and dental abscesses present. No gum lesions.     Tongue: No lesions. Tongue does not deviate from midline.     Pharynx: Oropharynx is clear.   Eyes:      Comments: Small bandlike appearance portable to the left lower lid with no drainage noted  Cardiovascular:     Rate and Rhythm: Normal rate.  Pulmonary:     Effort: Pulmonary effort is normal.  Neurological:     Mental Status: He is alert.      UC Treatments / Results  Labs (all labs ordered are listed, but only abnormal results  are displayed) Labs Reviewed - No data to display  EKG   Radiology No results found.  Procedures Procedures (including critical care time)  Medications Ordered in UC Medications - No data to display  Initial Impression / Assessment and Plan / UC Course  I have reviewed the triage vital signs and the nursing notes.  Pertinent labs & imaging results that were available during my care of the patient were reviewed by me and considered in my medical decision making (see chart for details).     To the area.Apply warm compresses You need to follow-up with a dentist for the tooth abscess Take full dose of antibiotics with food Take Tylenol or Motrin as needed for pain Final Clinical Impressions(s) / UC Diagnoses   Final diagnoses:  Dental abscess  Facial swelling  Hordeolum externum of left lower eyelid     Discharge Instructions      To the area.Apply warm compresses You need to follow-up with a dentist for the tooth abscess Take full dose of antibiotics with food Take Tylenol or Motrin as needed for pain     ED Prescriptions     Medication Sig Dispense Auth. Provider   naproxen (NAPROSYN) 500 MG tablet Take 1 tablet (500 mg total) by mouth 2 (two) times daily with a meal. 20 tablet Morley Kos L, NP   amoxicillin-clavulanate (AUGMENTIN) 875-125 MG tablet Take 1 tablet by mouth every 12 (twelve) hours. 14 tablet Marney Setting, NP      PDMP not reviewed this encounter.   Marney Setting, NP 01/25/23 1353

## 2023-01-25 NOTE — ED Triage Notes (Signed)
Patient reports that he has left lower gum swelling. Patient denies having a dental issue.

## 2023-01-25 NOTE — Discharge Instructions (Addendum)
To the area.Apply warm compresses You need to follow-up with a dentist for the tooth abscess Take full dose of antibiotics with food Take Tylenol or Motrin as needed for pain

## 2023-01-25 NOTE — ED Provider Notes (Signed)
Patient not seen by me.  Seen by another provider today.  Alan Ripper, NP [facility]    Rudolpho Sevin, NP 01/25/23 [phone]

## 2023-10-25 ENCOUNTER — Encounter (HOSPITAL_COMMUNITY): Payer: Self-pay | Admitting: Emergency Medicine

## 2023-10-25 ENCOUNTER — Emergency Department (HOSPITAL_COMMUNITY)
Admission: EM | Admit: 2023-10-25 | Discharge: 2023-10-25 | Discharge status: Against Medical Advice | Payer: Self-pay | Attending: Emergency Medicine | Admitting: Emergency Medicine

## 2023-10-25 DIAGNOSIS — R519 Headache, unspecified: Secondary | ICD-10-CM | POA: Insufficient documentation

## 2023-10-25 DIAGNOSIS — Z5321 Procedure and treatment not carried out due to patient leaving prior to being seen by health care provider: Secondary | ICD-10-CM | POA: Insufficient documentation

## 2023-10-25 LAB — CBC
HCT: 40.2 % (ref 39.0–52.0)
Hemoglobin: 13.2 g/dL (ref 13.0–17.0)
MCH: 28.9 pg (ref 26.0–34.0)
MCHC: 32.8 g/dL (ref 30.0–36.0)
MCV: 88 fL (ref 80.0–100.0)
Platelets: 357 10*3/uL (ref 150–400)
RBC: 4.57 MIL/uL (ref 4.22–5.81)
RDW: 13.5 % (ref 11.5–15.5)
WBC: 12 10*3/uL — ABNORMAL HIGH (ref 4.0–10.5)
nRBC: 0 % (ref 0.0–0.2)

## 2023-10-25 LAB — COMPREHENSIVE METABOLIC PANEL
ALT: 17 U/L (ref 0–44)
AST: 17 U/L (ref 15–41)
Albumin: 4 g/dL (ref 3.5–5.0)
Alkaline Phosphatase: 46 U/L (ref 38–126)
Anion gap: 10 (ref 5–15)
BUN: 10 mg/dL (ref 6–20)
CO2: 24 mmol/L (ref 22–32)
Calcium: 9.3 mg/dL (ref 8.9–10.3)
Chloride: 105 mmol/L (ref 98–111)
Creatinine, Ser: 1.04 mg/dL (ref 0.61–1.24)
GFR, Estimated: >60 mL/min (ref >60)
Glucose, Bld: 92 mg/dL (ref 70–99)
Potassium: 4.1 mmol/L (ref 3.5–5.1)
Sodium: 139 mmol/L (ref 135–145)
Total Bilirubin: 0.6 mg/dL (ref 0.3–1.2)
Total Protein: 7.4 g/dL (ref 6.5–8.1)

## 2023-10-25 NOTE — ED Triage Notes (Signed)
Pt here from home with c/o right side headache on and off , no n/v or light sensitivity

## 2023-10-25 NOTE — ED Notes (Signed)
Pt left ama due to long ED wait times.  

## 2023-12-23 ENCOUNTER — Encounter (HOSPITAL_COMMUNITY): Payer: Self-pay

## 2023-12-23 ENCOUNTER — Other Ambulatory Visit: Payer: Self-pay

## 2023-12-23 ENCOUNTER — Emergency Department (HOSPITAL_COMMUNITY)
Admission: EM | Admit: 2023-12-23 | Discharge: 2023-12-23 | Disposition: A | Discharge status: Home / Self Care | Payer: Self-pay | Attending: Emergency Medicine | Admitting: Emergency Medicine

## 2023-12-23 DIAGNOSIS — R519 Headache, unspecified: Secondary | ICD-10-CM | POA: Insufficient documentation

## 2023-12-23 DIAGNOSIS — K0889 Other specified disorders of teeth and supporting structures: Secondary | ICD-10-CM | POA: Insufficient documentation

## 2023-12-23 MED ORDER — IBUPROFEN 800 MG PO TABS: 800.0000 mg | ORAL_TABLET | Freq: Three times a day (TID) | ORAL | 0 refills | Status: AC | PRN

## 2023-12-23 MED ORDER — PENICILLIN V POTASSIUM 500 MG PO TABS: 500.0000 mg | ORAL_TABLET | Freq: Four times a day (QID) | ORAL | 0 refills | Status: AC

## 2023-12-23 NOTE — ED Provider Notes (Incomplete)
Emergency Department Provider Note   I have reviewed the triage vital signs and the nursing notes.   HISTORY  Chief Complaint Headache   HPI Dennis Pope is a 37 y.o. male ***   {**SYMPTOM/COMPLAINT  LOCATION (describe anatomically) DURATION (when did it start) TIMING (onset and pattern) SEVERITY (0-10, mild/moderate/severe) QUALITY (description of symptoms) CONTEXT (recent surgery, new meds, activity, etc.) MODIFYINGFACTORS (what makes it better/worse) ASSOCIATEDSYMPTOMS (pertinent positives and negatives)**}  History reviewed. No pertinent past medical history.  Review of Systems {** Revise as appropriate then delete this line - Documentation of 10 systems OR 2 systems and "10-point ROS otherwise negative" is required **}Constitutional: No fever/chills Eyes: No visual changes. ENT: No sore throat. Cardiovascular: Denies chest pain. Respiratory: Denies shortness of breath. Gastrointestinal: No abdominal pain.  No nausea, no vomiting.  No diarrhea.  No constipation. Genitourinary: Negative for dysuria. Musculoskeletal: Negative for back pain. Skin: Negative for rash. Neurological: Negative for headaches, focal weakness or numbness. {**Psychiatric:  Endocrine:  Hematological/Lymphatic:  Allergic/Immunilogical: **}  ____________________________________________   PHYSICAL EXAM:  VITAL SIGNS: ED Triage Vitals  Encounter Vitals Group     BP 12/23/23 1015 (!) 136/93     Systolic BP Percentile --      Diastolic BP Percentile --      Pulse Rate 12/23/23 1015 88     Resp 12/23/23 1015 14     Temp 12/23/23 1015 98 F (36.7 C)     Temp src --      SpO2 12/23/23 1015 100 %     Weight 12/23/23 1019 160 lb (72.6 kg)     Height 12/23/23 1019 5\' 10"  (1.778 m)     Head Circumference --      Peak Flow --      Pain Score 12/23/23 1019 5     Pain Loc --      Pain Education --      Exclude from Growth Chart --    {** Revise as appropriate then delete this line -  8 systems required **} Constitutional: Alert and oriented. Well appearing and in no acute distress. Eyes: Conjunctivae are normal. PERRL. EOMI. Head: Atraumatic. {**Ears:  Healthy appearing ear canals and TMs bilaterally **}Nose: No congestion/rhinnorhea. Mouth/Throat: Mucous membranes are moist.  Oropharynx non-erythematous. Neck: No stridor.  No meningeal signs.  {**No cervical spine tenderness to palpation.**} Cardiovascular: Normal rate, regular rhythm. Good peripheral circulation. Grossly normal heart sounds.   Respiratory: Normal respiratory effort.  No retractions. Lungs CTAB. Gastrointestinal: Soft and nontender. No distention.  {**Genitourinary:  **}Musculoskeletal: No lower extremity tenderness nor edema. No gross deformities of extremities. Neurologic:  Normal speech and language. No gross focal neurologic deficits are appreciated.  Skin:  Skin is warm, dry and intact. No rash noted. {**Psychiatric: Mood and affect are normal. Speech and behavior are normal.**}  ____________________________________________   LABS (all labs ordered are listed, but only abnormal results are displayed)  Labs Reviewed - No data to display ____________________________________________  EKG  *** ____________________________________________  RADIOLOGY  No results found.  ____________________________________________   PROCEDURES  Procedure(s) performed:   Procedures   ____________________________________________   INITIAL IMPRESSION / ASSESSMENT AND PLAN / ED COURSE  Pertinent labs & imaging results that were available during my care of the patient were reviewed by me and considered in my medical decision making (see chart for details).   This patient is Presenting for Evaluation of ***, which {Range:23949} require a range of treatment options, and {MDMcomplaint:23950} a complaint that  involves a {MDMlevelrisk:23951} risk of morbidity and mortality.  The Differential Diagnoses  include***.  Critical Interventions-    Medications - No data to display  Reassessment after intervention:     I *** Additional Historical Information from ***, as the patient is ***.  I decided to review pertinent External Data, and in summary ***.   Clinical Laboratory Tests Ordered, included   Radiologic Tests Ordered, included ***. I independently interpreted the images and agree with radiology interpretation.   Cardiac Monitor Tracing which shows ***   Social Determinants of Health Risk ***  Consult complete with  Medical Decision Making: Summary: ***  Reevaluation with update and discussion with   ***Considered admission***  Patient's presentation is most consistent with {EM COPA:27473}   Disposition:   ____________________________________________  FINAL CLINICAL IMPRESSION(S) / ED DIAGNOSES  Final diagnoses:  None     NEW OUTPATIENT MEDICATIONS STARTED DURING THIS VISIT:  New Prescriptions   No medications on file    Note:  This document was prepared using Dragon voice recognition software and may include unintentional dictation errors.  Alona Bene, MD, [facility]

## 2023-12-23 NOTE — ED Triage Notes (Signed)
Pt c.o right sided headache for a while, states he thinks it is related to an infected tooth.

## 2023-12-23 NOTE — Discharge Instructions (Signed)

## 2024-04-21 ENCOUNTER — Encounter (HOSPITAL_COMMUNITY): Payer: Self-pay | Admitting: Emergency Medicine

## 2024-04-21 ENCOUNTER — Emergency Department (HOSPITAL_COMMUNITY)
Admission: EM | Admit: 2024-04-21 | Discharge: 2024-04-22 | Disposition: A | Discharge status: Home / Self Care | Payer: Self-pay | Attending: Emergency Medicine | Admitting: Emergency Medicine

## 2024-04-21 ENCOUNTER — Other Ambulatory Visit: Payer: Self-pay

## 2024-04-21 DIAGNOSIS — L0231 Cutaneous abscess of buttock: Secondary | ICD-10-CM | POA: Insufficient documentation

## 2024-04-21 MED ORDER — HYDROCODONE-ACETAMINOPHEN 5-325 MG PO TABS: 1.0000 | ORAL_TABLET | ORAL | 0 refills | Status: AC | PRN

## 2024-04-21 MED ORDER — LIDOCAINE HCL (PF) 1 % IJ SOLN
30.0000 mL | Freq: Once | INTRAMUSCULAR | Status: DC
  Filled 2024-04-21: qty 30

## 2024-04-21 MED ORDER — DOXYCYCLINE HYCLATE 100 MG PO CAPS: 100.0000 mg | ORAL_CAPSULE | Freq: Two times a day (BID) | ORAL | 0 refills | Status: AC

## 2024-04-21 NOTE — ED Triage Notes (Signed)
 Pt came in for abscess to right buttock that is not open.  It has been present for a few days.

## 2024-04-24 NOTE — ED Provider Notes (Signed)
 [place] EMERGENC[facility] DEPARTMENT AT [facility] Provider Note   CSN: [id number] Arrival date & time: 04/21/24  1421     History  Chief Complaint  Patient presents with   Abscess    Dennis Pope is a 38 y.o. male.  Patient complains of an abscess to his left buttock.  Patient reports area is painful.  He has had to have multiple areas drained in the past and is requesting drainage at this time.  Patient reports that he also has areas on the scrotum that have drained.  Patient states that he keeps getting the one on his buttock over and over he thinks that there is something inside out that is not coming out.  The history is provided by the patient. No language interpreter was used.  Abscess Location:  Pelvis Pelvic abscess location:  L buttock Size:  2.0 Abscess quality: redness and warmth   Progression:  Worsening Chronicity:  New Context: not diabetes   Relieved by:  Nothing Ineffective treatments:  None tried Associated symptoms: no fever        Home Medications Prior to Admission medications   Medication Sig Start Date End Date Taking? Authorizing Provider  doxycycline  (VIBRAMYCIN ) 100 MG capsule Take 1 capsule (100 mg total) by mouth 2 (two) times daily. 04/21/24  Yes Demetra Moya K, PA-C  HYDROcodone -acetaminophen  (NORCO/VICODIN) 5-325 MG tablet Take 1 tablet by mouth every 4 (four) hours as needed. 04/21/24 04/21/25 Yes Jonnathan Birman K, PA-C  ibuprofen  (ADVIL ) 800 MG tablet Take 1 tablet (800 mg total) by mouth every 8 (eight) hours as needed. 12/23/23   Long, Joshua G, MD      Allergies    Patient has no known allergies.    Review of Systems   Review of Systems  Constitutional:  Negative for fever.  All other systems reviewed and are negative.   Physical Exam Updated Vital Signs BP (!) 148/97 (BP Location: Right Arm)   Pulse (!) 105   Temp 98.5 F (36.9 C)   Resp 16   Ht 5\' 10"  (1.778 m)   Wt 73.9 kg   SpO2 98%   BMI 23.39 kg/m  Physical  Exam Vitals and nursing note reviewed.  Constitutional:      Appearance: He is well-developed.  HENT:     Head: Normocephalic.  Cardiovascular:     Rate and Rhythm: Normal rate.  Pulmonary:     Effort: Pulmonary effort is normal.  Abdominal:     General: There is no distension.  Musculoskeletal:        General: Normal range of motion.     Comments: Swollen area left buttock tender to palpation,  Skin:    General: Skin is warm.  Neurological:     General: No focal deficit present.     Mental Status: He is alert and oriented to person, place, and time.     ED Results / Procedures / Treatments   Labs (all labs ordered are listed, but only abnormal results are displayed) Labs Reviewed - No data to display  EKG None  Radiology No results found.  Procedures .Incision and Drainage  Date/Time: 04/24/2024 8:05 AM  Performed by: Sandi Crosby, PA-C Authorized by: Sandi Crosby, PA-C   Consent:    Consent obtained:  Verbal   Consent given by:  Patient   Risks, benefits, and alternatives were discussed: yes     Risks discussed:  Incomplete drainage   Alternatives discussed:  No treatment Universal  protocol:    Procedure explained and questions answered to patient or proxy's satisfaction: yes     Immediately prior to procedure, a time out was called: yes     Patient identity confirmed:  Verbally with patient Location:    Type:  Abscess   Size:  2   Location:  Trunk Pre-procedure details:    Skin preparation:  Povidone-iodine Anesthesia:    Anesthesia method:  Local infiltration Procedure type:    Complexity:  Complex Procedure details:    Incision types:  Single straight   Wound management:  Probed and deloculated and irrigated with saline   Drainage:  Purulent   Drainage amount:  Copious   Wound treatment:  Wound left open   Packing materials:  None Post-procedure details:    Procedure completion:  Tolerated     Medications Ordered in ED Medications -  No data to display  ED Course/ Medical Decision Making/ A&P                                 Medical Decision Making Patient complains of an abscess on his left buttock  Risk Prescription drug management. Risk Details: Patient request I&D.  I was able to incise the area with a large amount of purulent drainage, I irrigated copiously.  I discussed with the patient his concerns that this area keeps coming back in the same site.  He is advised to follow-up.  Patient is given a prescription for doxycycline  and hydrocodone .  He is advised to return to the emergency department if any problems.           Final Clinical Impression(s) / ED Diagnoses Final diagnoses:  Abscess of buttock, left    Rx / DC Orders ED Discharge Orders          Ordered    doxycycline  (VIBRAMYCIN ) 100 MG capsule  2 times daily        04/21/24 1611    HYDROcodone -acetaminophen  (NORCO/VICODIN) 5-325 MG tablet  Every 4 hours PRN        04/21/24 1611           An After Visit Summary was printed and given to the patient.    Sandi Crosby, PA-C 04/24/24 8469    Mozell Arias, MD 04/27/24 [phone]

## 2024-07-29 ENCOUNTER — Encounter (HOSPITAL_COMMUNITY): Payer: Self-pay | Admitting: Emergency Medicine

## 2024-07-29 ENCOUNTER — Emergency Department (HOSPITAL_COMMUNITY)
Admission: EM | Admit: 2024-07-29 | Discharge: 2024-07-29 | Disposition: A | Discharge status: Home / Self Care | Payer: Self-pay | Attending: Emergency Medicine | Admitting: Emergency Medicine

## 2024-07-29 DIAGNOSIS — K029 Dental caries, unspecified: Secondary | ICD-10-CM | POA: Insufficient documentation

## 2024-07-29 MED ORDER — AMOXICILLIN-POT CLAVULANATE 875-125 MG PO TABS: 1.0000 | ORAL_TABLET | Freq: Two times a day (BID) | ORAL | 0 refills | Status: AC

## 2024-07-29 NOTE — Discharge Instructions (Signed)
 Start the antibiotic today and continue the ibuprofen .  Do salt water rinses after eating.

## 2024-07-29 NOTE — ED Triage Notes (Signed)
 Pt here from home with c/o right upper dental pain times 3 days , has been taking otc meds without relief

## 2024-07-29 NOTE — ED Notes (Addendum)
 Paper work reviewed with pt. Pt is in no new onset distress at this time.Pt will be driving himself home.

## 2024-07-29 NOTE — ED Provider Notes (Signed)
  [place] EMERGENCY DEPARTMENT AT [facility] Provider Note   CSN: [id number] Arrival date & time: 07/29/24  1125     Patient presents with: Dental Pain   Dennis Pope is a 38 y.o. male.   Patient is a healthy 38 year old male presenting today due to 3 days of worsening dental pain.  It is the left upper premolar that has broken off about 3 years ago but has never given him pain until now.  He denies any swelling of the face or mouth.  The history is provided by the patient.  Dental Pain      Prior to Admission medications   Medication Sig Start Date End Date Taking? Authorizing Provider  amoxicillin -clavulanate (AUGMENTIN ) 875-125 MG tablet Take 1 tablet by mouth every 12 (twelve) hours. 07/29/24  Yes Doretha Folks, MD  doxycycline  (VIBRAMYCIN ) 100 MG capsule Take 1 capsule (100 mg total) by mouth 2 (two) times daily. 04/21/24   Sofia, Leslie K, PA-C  HYDROcodone -acetaminophen  (NORCO/VICODIN) 5-325 MG tablet Take 1 tablet by mouth every 4 (four) hours as needed. 04/21/24 04/21/25  Sofia, Leslie K, PA-C  ibuprofen  (ADVIL ) 800 MG tablet Take 1 tablet (800 mg total) by mouth every 8 (eight) hours as needed. 12/23/23   Long, Joshua G, MD    Allergies: Patient has no known allergies.    Review of Systems  Updated Vital Signs BP (!) 163/101   Pulse 71   Temp 97.7 F (36.5 C)   Resp 17   SpO2 100%   Physical Exam Vitals and nursing note reviewed.  HENT:     Head: Normocephalic.     Mouth/Throat:   Neurological:     Mental Status: He is alert.     (all labs ordered are listed, but only abnormal results are displayed) Labs Reviewed - No data to display  EKG: None  Radiology: No results found.   Procedures   Medications Ordered in the ED - No data to display                                  Medical Decision Making  Pt with dental carry and no facial swelling.  No signs of ludwig's angina or difficulty swallowing and no systemic symptoms. Will  treat with augmentin  and have pt f/u with dentist.      Final diagnoses:  Dental caries    ED Discharge Orders          Ordered    amoxicillin -clavulanate (AUGMENTIN ) 875-125 MG tablet  Every 12 hours        07/29/24 1212               Doretha Folks, MD 07/29/24 1214

## 2024-11-07 ENCOUNTER — Emergency Department (HOSPITAL_COMMUNITY)
Admission: EM | Admit: 2024-11-07 | Discharge: 2024-11-08 | Disposition: A | Discharge status: Home / Self Care | Payer: Self-pay | Attending: Emergency Medicine | Admitting: Emergency Medicine

## 2024-11-07 ENCOUNTER — Other Ambulatory Visit: Payer: Self-pay

## 2024-11-07 ENCOUNTER — Emergency Department (HOSPITAL_COMMUNITY): Payer: Self-pay

## 2024-11-07 ENCOUNTER — Encounter (HOSPITAL_COMMUNITY): Payer: Self-pay | Admitting: *Deleted

## 2024-11-07 DIAGNOSIS — H5789 Other specified disorders of eye and adnexa: Secondary | ICD-10-CM | POA: Insufficient documentation

## 2024-11-07 DIAGNOSIS — R0789 Other chest pain: Secondary | ICD-10-CM | POA: Insufficient documentation

## 2024-11-07 DIAGNOSIS — K0889 Other specified disorders of teeth and supporting structures: Secondary | ICD-10-CM | POA: Insufficient documentation

## 2024-11-07 DIAGNOSIS — H02849 Edema of unspecified eye, unspecified eyelid: Secondary | ICD-10-CM

## 2024-11-07 LAB — BASIC METABOLIC PANEL WITH GFR
Anion gap: 12 (ref 5–15)
BUN: 9 mg/dL (ref 6–20)
CO2: 23 mmol/L (ref 22–32)
Calcium: 9.2 mg/dL (ref 8.9–10.3)
Chloride: 103 mmol/L (ref 98–111)
Creatinine, Ser: 0.9 mg/dL (ref 0.61–1.24)
GFR, Estimated: >60 mL/min (ref >60)
Glucose, Bld: 93 mg/dL (ref 70–99)
Potassium: 3.7 mmol/L (ref 3.5–5.1)
Sodium: 138 mmol/L (ref 135–145)

## 2024-11-07 LAB — CBC
HCT: 40.6 % (ref 39.0–52.0)
Hemoglobin: 13.8 g/dL (ref 13.0–17.0)
MCH: 29.5 pg (ref 26.0–34.0)
MCHC: 34 g/dL (ref 30.0–36.0)
MCV: 86.8 fL (ref 80.0–100.0)
Platelets: 361 K/uL (ref 150–400)
RBC: 4.68 MIL/uL (ref 4.22–5.81)
RDW: 13.9 % (ref 11.5–15.5)
WBC: 13.5 K/uL — ABNORMAL HIGH (ref 4.0–10.5)
nRBC: 0 % (ref 0.0–0.2)

## 2024-11-07 LAB — TROPONIN I (HIGH SENSITIVITY)
Troponin I (High Sensitivity): <2 ng/L (ref <18)
Troponin I (High Sensitivity): 3 ng/L (ref <18)

## 2024-11-07 NOTE — ED Provider Triage Note (Signed)
 Emergency Medicine Provider Triage Evaluation Note  NHIA HEAPHY , a 38 y.o. male  was evaluated in triage.  Pt complains of multiple.  He would like the bump on his lower eyelid drain has been there for the past year and a half.  He is also requesting a new dental referral.  He was seen in August for dental pain and had a dental extraction done however was told he is a new referral in order to be seen again out of the ER.  Additionally, he reports he is having some chest pain waxing waning for past few days.  Denies any shortness of breath.  Denies any trauma or sudden onset.  He feels this is all related to his dental pain.  Denies any fever or recent cough or cold symptoms.  Denies any lower leg edema.  Review of Systems  Positive:  Negative:   Physical Exam  BP (!) 168/102   Pulse 78   Temp 98.9 F (37.2 C) (Oral)   Resp 18   Ht 5' 10 (1.778 m)   Wt 73.9 kg   SpO2 98%   BMI 23.38 kg/m  Gen:   Awake, no distress   Resp:  Normal effort  MSK:   Moves extremities without difficulty  Other:  RRR, palpable distal pulses.   Medical Decision Making  Medically screening exam initiated at 7:37 PM.  Appropriate orders placed.  TRACER GUTRIDGE was informed that the remainder of the evaluation will be completed by another provider, this initial triage assessment does not replace that evaluation, and the importance of remaining in the ED until their evaluation is complete.  Cardiac workup initiated.    Bernis Ernst, PA-C 11/07/24 2018

## 2024-11-07 NOTE — ED Triage Notes (Signed)
 C/O left bottom eyelid pain and is now causing headaches/ chest tightness/ hand numbness that started yesterday. Denies blurred vision. Axox4.

## 2024-11-08 MED ORDER — NAPROXEN 250 MG PO TABS
500.0000 mg | ORAL_TABLET | Freq: Once | ORAL | Status: AC
  Administered 2024-11-08: 500 mg via ORAL
  Filled 2024-11-08: qty 2

## 2024-11-08 MED ORDER — NAPROXEN 500 MG PO TABS: 500.0000 mg | ORAL_TABLET | Freq: Two times a day (BID) | ORAL | 0 refills | Status: AC

## 2024-11-08 MED ORDER — PENICILLIN V POTASSIUM 250 MG PO TABS
500.0000 mg | ORAL_TABLET | Freq: Once | ORAL | Status: AC
  Administered 2024-11-08: 500 mg via ORAL
  Filled 2024-11-08: qty 2

## 2024-11-08 MED ORDER — ERYTHROMYCIN 5 MG/GM OP OINT: TOPICAL_OINTMENT | OPHTHALMIC | 0 refills | Status: AC

## 2024-11-08 MED ORDER — PENICILLIN V POTASSIUM 500 MG PO TABS: 500.0000 mg | ORAL_TABLET | Freq: Four times a day (QID) | ORAL | 0 refills | Status: AC

## 2024-11-08 NOTE — Discharge Instructions (Addendum)
 You can try to follow-up with Dr. Shlomo again, however his office is not on call today.  Dr. Maryruth is on call today, I have attached his office information as well if Dr. Shlomo is not able to see you again. Can also see Dr. Octavia (eye doctor) about your eyelid-- call to set this up. Take the prescribed medication as directed. Return to the ED for new or worsening symptoms.

## 2024-11-08 NOTE — ED Provider Notes (Signed)
 [place] EMERGENCY DEPARTMENT AT [facility] Provider Note   CSN: [id number] Arrival date & time: 11/07/24  8187     Patient presents with: Eye Pain   Dennis Pope is a 38 y.o. male.   The history is provided by the patient and medical records.  Eye Pain Associated symptoms include chest pain.   38 year old male here with multiple complaints.  Dental issues--has been having some right upper dental pain over the past few days.  Tooth is broken so only has fragments left.  He feels like there may be some swelling.  He denies any fever.  No trouble swallowing.  Had prior dental work done by Dr. Shlomo and called his office but told he needed a new referral from the ER.  He is requesting that in some antibiotics today. Left eye problem--states has had some swelling to his left lower eyelid for the past year and a half or so.  States it was treated as a stye initially but has not seemed to get any better.  Was told at one point he needed to see an eye doctor about this but has not been referred.  Denies visual disturbance. Chest pain--he reports very mild discomfort, does not even really quantify it it is pain.  He states he feels like it is related to his tooth.  Seems to wax and wane in severity.  He denies any shortness of breath, cough, or hemoptysis.  No prior cardiac issues.  Prior to Admission medications   Medication Sig Start Date End Date Taking? Authorizing Provider  erythromycin ophthalmic ointment Place a 1/2 inch ribbon of ointment into the lower eyelid. 11/08/24  Yes Jarold Olam HERO, PA-C  naproxen  (NAPROSYN ) 500 MG tablet Take 1 tablet (500 mg total) by mouth 2 (two) times daily. 11/08/24  Yes Jarold Olam HERO, PA-C  penicillin  v potassium (VEETID) 500 MG tablet Take 1 tablet (500 mg total) by mouth 4 (four) times daily for 10 days. 11/08/24 11/18/24 Yes Jarold Olam HERO, PA-C  amoxicillin -clavulanate (AUGMENTIN ) 875-125 MG tablet Take 1 tablet by mouth every 12  (twelve) hours. 07/29/24   Doretha Folks, MD  doxycycline  (VIBRAMYCIN ) 100 MG capsule Take 1 capsule (100 mg total) by mouth 2 (two) times daily. 04/21/24   Sofia, Leslie K, PA-C  HYDROcodone -acetaminophen  (NORCO/VICODIN) 5-325 MG tablet Take 1 tablet by mouth every 4 (four) hours as needed. 04/21/24 04/21/25  Sofia, Leslie K, PA-C  ibuprofen  (ADVIL ) 800 MG tablet Take 1 tablet (800 mg total) by mouth every 8 (eight) hours as needed. 12/23/23   Long, Joshua G, MD    Allergies: Patient has no known allergies.    Review of Systems  HENT:  Positive for dental problem.   Eyes:  Positive for pain.  Cardiovascular:  Positive for chest pain.  All other systems reviewed and are negative.   Updated Vital Signs BP (!) 174/112 (BP Location: Right Arm)   Pulse 75   Temp 98.7 F (37.1 C) (Oral)   Resp 18   Ht 5' 10 (1.778 m)   Wt 73.9 kg   SpO2 100%   BMI 23.38 kg/m   Physical Exam Vitals and nursing note reviewed.  Constitutional:      Appearance: He is well-developed.  HENT:     Head: Normocephalic and atraumatic.     Mouth/Throat:      Comments: Teeth largely in fair dentition, affected tooth as above is broken, fragments remain in the gingiva with some localized swelling and  inflammation, no discrete fluid collection or abscess, handling secretions appropriately, no trismus, no facial or neck swelling, normal phonation without stridor Eyes:     Conjunctiva/sclera: Conjunctivae normal.     Pupils: Pupils are equal, round, and reactive to light.     Comments: Minuscule swelling noted to left lower eyelid, seems more concentrated centrally without discrete stye present, no tearing or conjunctival injection; no upper lid edema or erythema present, EOMs intact without pain  Cardiovascular:     Rate and Rhythm: Normal rate and regular rhythm.     Heart sounds: Normal heart sounds.  Pulmonary:     Effort: Pulmonary effort is normal.     Breath sounds: Normal breath sounds.  Abdominal:      General: Bowel sounds are normal.     Palpations: Abdomen is soft.  Musculoskeletal:        General: Normal range of motion.     Cervical back: Normal range of motion.  Skin:    General: Skin is warm and dry.  Neurological:     Mental Status: He is alert and oriented to person, place, and time.     (all labs ordered are listed, but only abnormal results are displayed) Labs Reviewed  CBC - Abnormal; Notable for the following components:      Result Value   WBC 13.5 (*)    All other components within normal limits  BASIC METABOLIC PANEL WITH GFR  TROPONIN I (HIGH SENSITIVITY)  TROPONIN I (HIGH SENSITIVITY)    EKG: None  Radiology: DG Chest 2 View Result Date: 11/07/2024 EXAM: 2 VIEW(S) XRAY OF THE CHEST 11/07/2024 10:09:00 PM COMPARISON: None available. CLINICAL HISTORY: CP CP FINDINGS: LUNGS AND PLEURA: No focal pulmonary opacity. No pleural effusion. No pneumothorax. HEART AND MEDIASTINUM: No acute abnormality of the cardiac and mediastinal silhouettes. BONES AND SOFT TISSUES: No acute osseous abnormality. IMPRESSION: 1. No acute process. Electronically signed by: Dorethia Molt MD 11/07/2024 10:13 PM EST RP Workstation: [id number]     Procedures   Medications Ordered in the ED  penicillin  v potassium (VEETID) tablet 500 mg (500 mg Oral Given 11/08/24 0131)  naproxen  (NAPROSYN ) tablet 500 mg (500 mg Oral Given 11/08/24 0131)                                    Medical Decision Making Amount and/or Complexity of Data Reviewed Labs: ordered. Radiology: ordered and independent interpretation performed. ECG/medicine tests: ordered and independent interpretation performed.  Risk Prescription drug management.   38 y.o. M here with multiple complaints:   Eye swelling-- left lower eyelid.  Concentrated centrally.  No conjunctival injection, drainage, or tearing.  No discrete stye seen.  Will refer to optho.  Can try some topical erythromycin for now.  Does not appear  consistent with pinkeye or preseptal/orbital cellulitis. Dental pain--right upper premolar is broken.  Some gingival swelling without discrete abscess or fluid collection.  No facial or neck swelling, hand excretions well, no stridor.  Will restart antibiotics and refer back to dentistry-- prefer Dr. Shlomo but he is not on call today.  Given information for his office as well as on call provider, Dr. Maryruth. Chest pain--atypical in nature.  EKG here is nonischemic.  Troponin negative x 2.  Chest x-ray is clear.  No cardiac risk factors.  Low suspicion for ACS.  Of note, patient's blood pressure is elevated here.  This may be pain related.  He is unsure what his baseline blood pressure is.  Will need to follow-up with primary care doctor for ongoing evaluation of this.  Final diagnoses:  Swelling of lower eyelid  Pain, dental  Atypical chest pain    ED Discharge Orders          Ordered    penicillin  v potassium (VEETID) 500 MG tablet  4 times daily        11/08/24 0146    naproxen  (NAPROSYN ) 500 MG tablet  2 times daily        11/08/24 0146    erythromycin ophthalmic ointment        11/08/24 0146               Jarold Olam HERO, PA-C 11/08/24 0151    Lorette Mayo, MD 11/08/24 [phone]

## 2024-11-20 ENCOUNTER — Emergency Department (HOSPITAL_COMMUNITY)
Admission: EM | Admit: 2024-11-20 | Discharge: 2024-11-20 | Disposition: A | Discharge status: Home / Self Care | Payer: Self-pay | Attending: Emergency Medicine | Admitting: Emergency Medicine

## 2024-11-20 ENCOUNTER — Other Ambulatory Visit: Payer: Self-pay

## 2024-11-20 DIAGNOSIS — Z139 Encounter for screening, unspecified: Secondary | ICD-10-CM

## 2024-11-20 DIAGNOSIS — R030 Elevated blood-pressure reading, without diagnosis of hypertension: Secondary | ICD-10-CM | POA: Insufficient documentation

## 2024-11-20 NOTE — ED Triage Notes (Signed)
 Patient arrives without complaint for better explanation of test results he received from this facility last week. Also needs another referral to dentist and eye doctor.

## 2024-11-20 NOTE — ED Provider Notes (Signed)
 [place] EMERGENCY DEPARTMENT AT [facility] Provider Note   CSN: [id number] Arrival date & time: 11/20/24  9172     Patient presents with: No chief complaint on file.   Dennis Pope is a 38 y.o. male.   Patient with no pertinent past medical history presents today requesting to review the results from his previous ER visit. Also requesting new referrals to ophthalmology and dentistry as he no longer has the referral information he was given at last visit. He has no complaints at this time.   The history is provided by the patient. No language interpreter was used.       Prior to Admission medications   Medication Sig Start Date End Date Taking? Authorizing Provider  amoxicillin -clavulanate (AUGMENTIN ) 875-125 MG tablet Take 1 tablet by mouth every 12 (twelve) hours. 07/29/24   Doretha Folks, MD  doxycycline  (VIBRAMYCIN ) 100 MG capsule Take 1 capsule (100 mg total) by mouth 2 (two) times daily. 04/21/24   Sofia, Leslie K, PA-C  erythromycin  ophthalmic ointment Place a 1/2 inch ribbon of ointment into the lower eyelid. 11/08/24   Jarold Olam HERO, PA-C  HYDROcodone -acetaminophen  (NORCO/VICODIN) 5-325 MG tablet Take 1 tablet by mouth every 4 (four) hours as needed. 04/21/24 04/21/25  Sofia, Leslie K, PA-C  ibuprofen  (ADVIL ) 800 MG tablet Take 1 tablet (800 mg total) by mouth every 8 (eight) hours as needed. 12/23/23   Long, Fonda MATSU, MD  naproxen  (NAPROSYN ) 500 MG tablet Take 1 tablet (500 mg total) by mouth 2 (two) times daily. 11/08/24   Jarold Olam HERO, PA-C    Allergies: Patient has no known allergies.    Review of Systems  All other systems reviewed and are negative.   Updated Vital Signs BP (!) 177/108 (BP Location: Right Arm)   Pulse 80   Temp 98.4 F (36.9 C)   Resp 16   SpO2 100%   Physical Exam Vitals and nursing note reviewed.  Constitutional:      General: He is not in acute distress.    Appearance: Normal appearance. He is normal weight. He is  not ill-appearing, toxic-appearing or diaphoretic.  HENT:     Head: Normocephalic and atraumatic.  Cardiovascular:     Rate and Rhythm: Normal rate.  Pulmonary:     Effort: Pulmonary effort is normal. No respiratory distress.  Musculoskeletal:        General: Normal range of motion.     Cervical back: Normal range of motion.  Skin:    General: Skin is warm and dry.  Neurological:     General: No focal deficit present.     Mental Status: He is alert.  Psychiatric:        Mood and Affect: Mood normal.        Behavior: Behavior normal.     (all labs ordered are listed, but only abnormal results are displayed) Labs Reviewed - No data to display  EKG: None  Radiology: No results found.   Procedures   Medications Ordered in the ED - No data to display                                  Medical Decision Making  Patient presents today requesting to review his results from his visit on 11/16. Chart reviewed, looks like he was seen for his eyelid swelling, dental pain, and chest pain. Labs showed WBC 13, no other abnormalities. CXR  was negative. Leukocytosis unclear etiology, potentially due to dental infection. Patient has no infectious symptoms at this time. Discussed results with patient who is understanding. Dental and ophthalmology referral provided. No other needs at this time.   Of note, his blood pressure is mildly elevated today. Recommend he discuss this with his pcp outpatient and purchase a cuff at home as well. Patient understanding.  No signs or symptoms to suggest hypertensive urgency or emergency today.  Evaluation and diagnostic testing in the emergency department does not suggest an emergent condition requiring admission or immediate intervention beyond what has been performed at this time.  Plan for discharge with close PCP follow-up.  Patient is understanding and amenable with plan, educated on red flag symptoms that would prompt immediate return.  Patient  discharged in stable condition.  Final diagnoses:  Encounter for medical screening examination  Elevated blood pressure reading    ED Discharge Orders     None     An After Visit Summary was printed and given to the patient.      Keonta Alsip A, PA-C 11/20/24 9082    Kingsley, Victoria K, DO 11/20/24 1529

## 2024-11-20 NOTE — Discharge Instructions (Signed)
 [facility] dental clinic -  [address] [place] - [place] [address] [phone]  [facility]) treats patients who are in the following groups:  Pediatrics with medicaid up to age 39 Patients over 57 with an orange card and a referral from a medical provider.  - If they are referred - there is a 40$ copay for any service - extration etc.  To get an orange card the following process is necessary.  Call the [facility] to apply for card - [phone] Once approved, you the patient will be set up with a medical provider Medical provider will refer patient to the [facility] clinic .  Offices accepting Medicaid patients:  Urgent tooth - [address] [place], [place] [address] [phone]  [facility] - [address] [place] [address] [phone]  [facility] (will see emergency dental patients from ED) - [address] [place], [place] [address] [phone]
